# Patient Record
Sex: Female | Born: 1965 | Race: Black or African American | Hispanic: No | Marital: Married | Smoking: Never smoker
Health system: Southern US, Community
[De-identification: ages and names within clinical notes are randomized; demographics above are authoritative.]

## PROBLEM LIST (undated history)

## (undated) DIAGNOSIS — I10 Essential (primary) hypertension: Secondary | ICD-10-CM

## (undated) HISTORY — DX: Essential (primary) hypertension: I10

## (undated) HISTORY — PX: APPENDECTOMY: SHX54

---

## 1997-08-15 ENCOUNTER — Other Ambulatory Visit: Admission: RE | Admit: 1997-08-15 | Discharge: 1997-08-15 | Payer: Self-pay | Admitting: Family Medicine

## 2000-05-19 ENCOUNTER — Encounter: Payer: Self-pay | Admitting: Emergency Medicine

## 2000-05-19 ENCOUNTER — Emergency Department (HOSPITAL_COMMUNITY): Admission: EM | Admit: 2000-05-19 | Discharge: 2000-05-19 | Payer: Self-pay | Admitting: Emergency Medicine

## 2002-07-21 ENCOUNTER — Encounter: Admission: RE | Admit: 2002-07-21 | Discharge: 2002-07-21 | Payer: Self-pay | Admitting: Orthopedic Surgery

## 2002-07-21 ENCOUNTER — Encounter: Payer: Self-pay | Admitting: Orthopedic Surgery

## 2003-04-23 ENCOUNTER — Inpatient Hospital Stay (HOSPITAL_COMMUNITY): Admission: AD | Admit: 2003-04-23 | Discharge: 2003-04-23 | Payer: Self-pay | Admitting: Obstetrics and Gynecology

## 2003-04-26 ENCOUNTER — Inpatient Hospital Stay (HOSPITAL_COMMUNITY): Admission: AD | Admit: 2003-04-26 | Discharge: 2003-04-27 | Payer: Self-pay | Admitting: Obstetrics and Gynecology

## 2003-04-28 HISTORY — PX: TUBAL LIGATION: SHX77

## 2003-05-10 ENCOUNTER — Inpatient Hospital Stay (HOSPITAL_COMMUNITY): Admission: AD | Admit: 2003-05-10 | Discharge: 2003-05-10 | Payer: Self-pay | Admitting: Obstetrics and Gynecology

## 2003-05-13 ENCOUNTER — Inpatient Hospital Stay (HOSPITAL_COMMUNITY): Admission: AD | Admit: 2003-05-13 | Discharge: 2003-05-14 | Payer: Self-pay | Admitting: *Deleted

## 2003-05-18 ENCOUNTER — Other Ambulatory Visit: Admission: RE | Admit: 2003-05-18 | Discharge: 2003-05-18 | Payer: Self-pay | Admitting: Obstetrics and Gynecology

## 2003-10-08 ENCOUNTER — Inpatient Hospital Stay (HOSPITAL_COMMUNITY): Admission: AD | Admit: 2003-10-08 | Discharge: 2003-10-09 | Payer: Self-pay | Admitting: Obstetrics and Gynecology

## 2003-11-12 ENCOUNTER — Observation Stay (HOSPITAL_COMMUNITY): Admission: AD | Admit: 2003-11-12 | Discharge: 2003-11-13 | Payer: Self-pay | Admitting: Obstetrics and Gynecology

## 2003-11-15 ENCOUNTER — Inpatient Hospital Stay (HOSPITAL_COMMUNITY): Admission: AD | Admit: 2003-11-15 | Discharge: 2003-11-18 | Payer: Self-pay | Admitting: Obstetrics and Gynecology

## 2003-11-15 ENCOUNTER — Encounter (INDEPENDENT_AMBULATORY_CARE_PROVIDER_SITE_OTHER): Payer: Self-pay | Admitting: *Deleted

## 2004-12-07 IMAGING — US US OB COMP LESS 14 WK
1 series · 14 of 28 positions shown · non-contrast
Comparison: none

CLINICAL DATA: 8 weeks, dehydration and spotting.

[Series 1: unknown · 0.32mm/px · 14 of 36 slices shown]
[im 2/36]
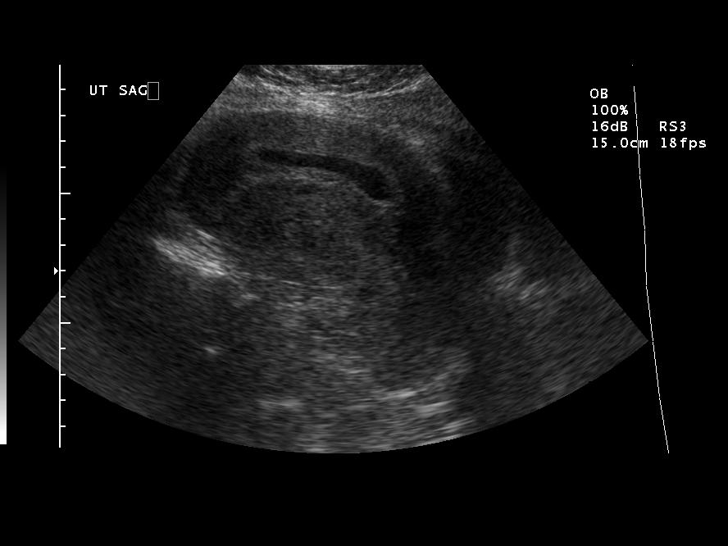
[im 4/36]
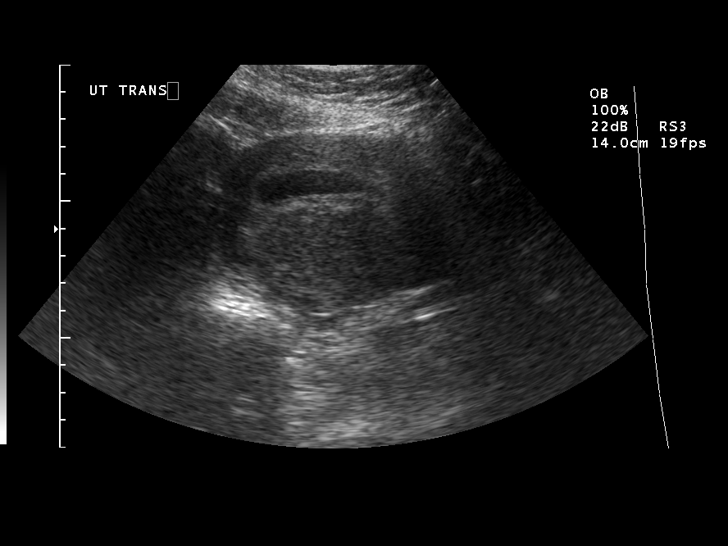
[im 7/36]
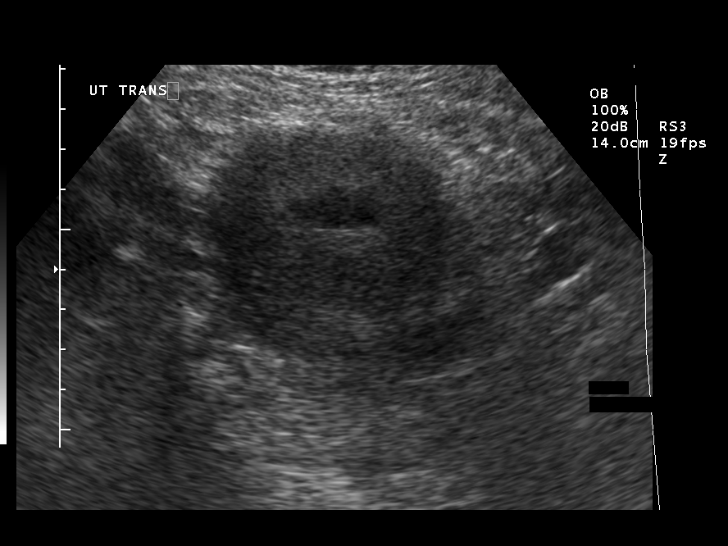
[im 10/36]
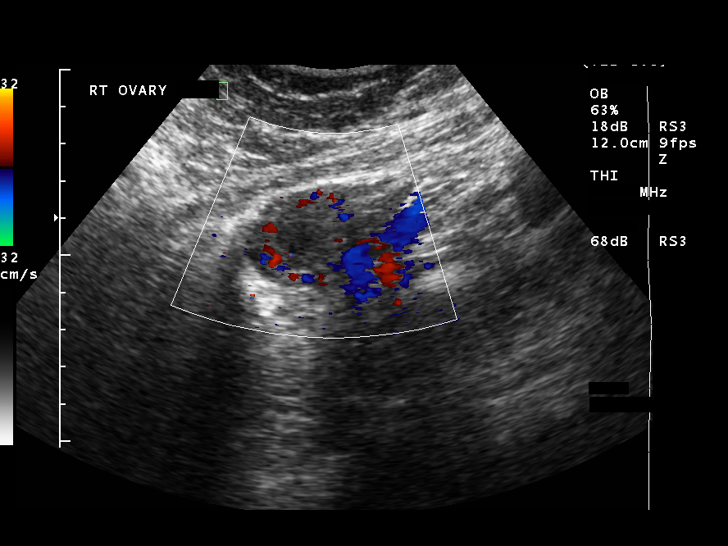
[im 12/36]
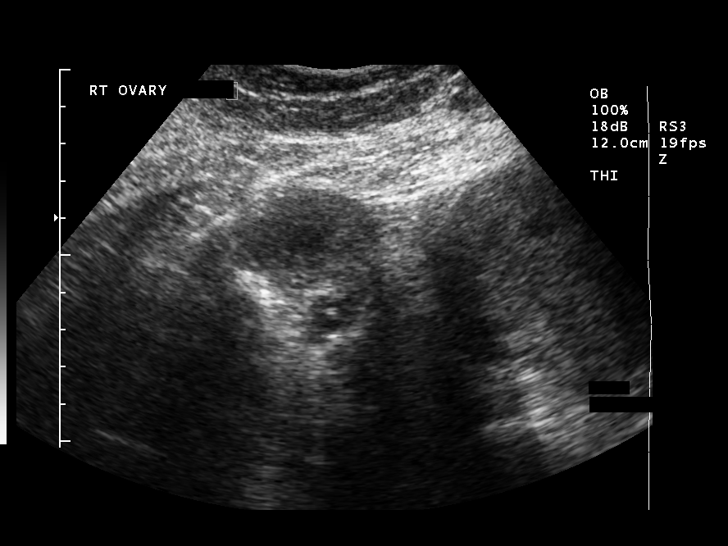
[im 15/36]
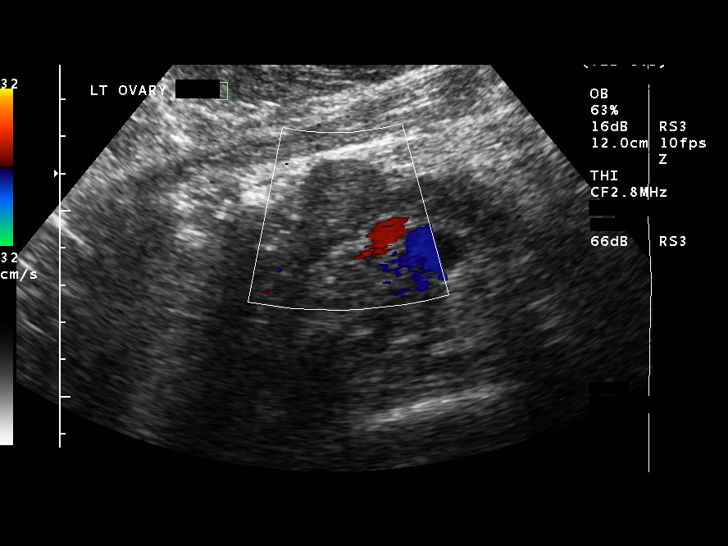
[im 17/36]
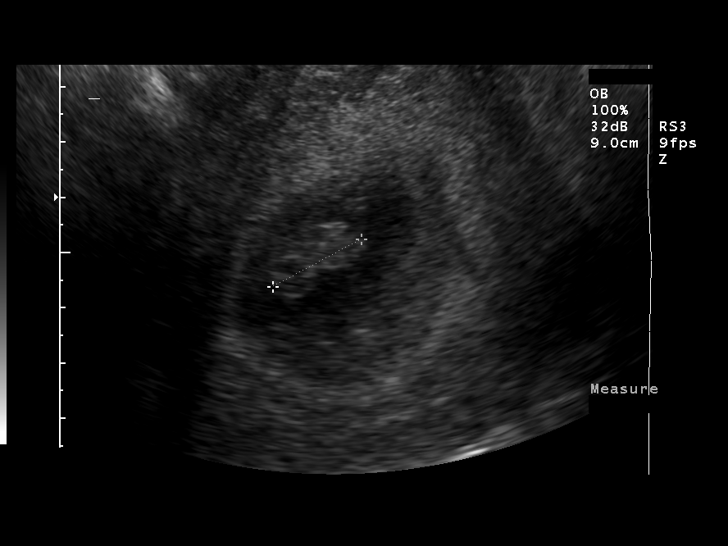
[im 20/36]
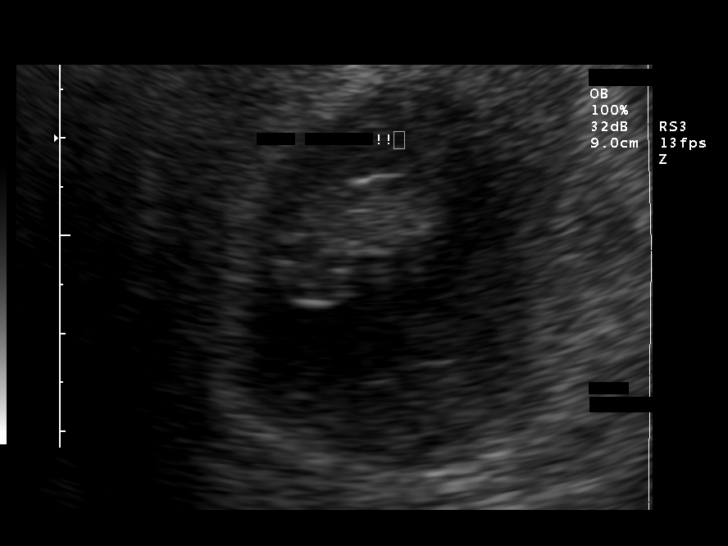
[im 23/36]
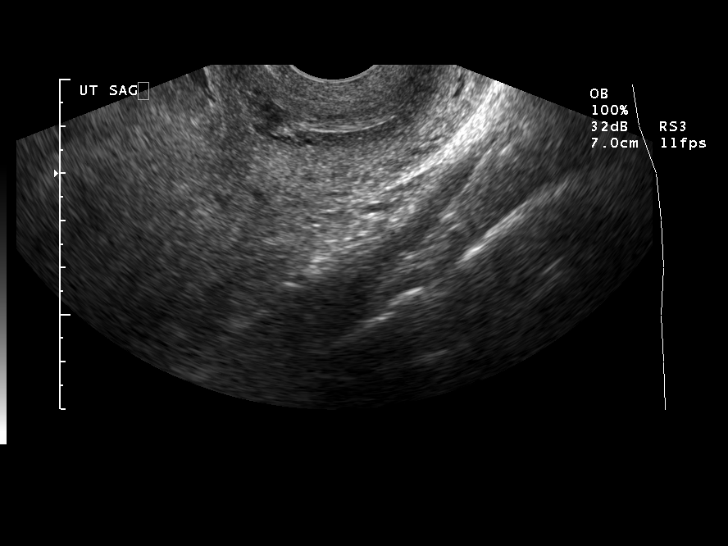
[im 25/36]
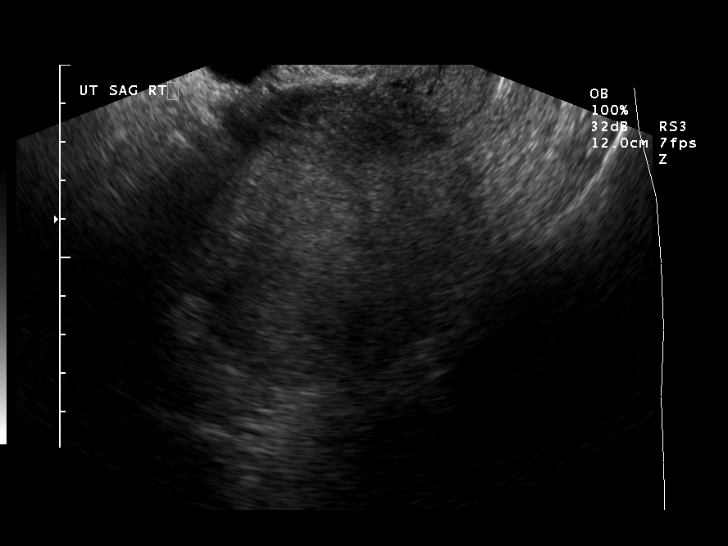
[im 28/36]
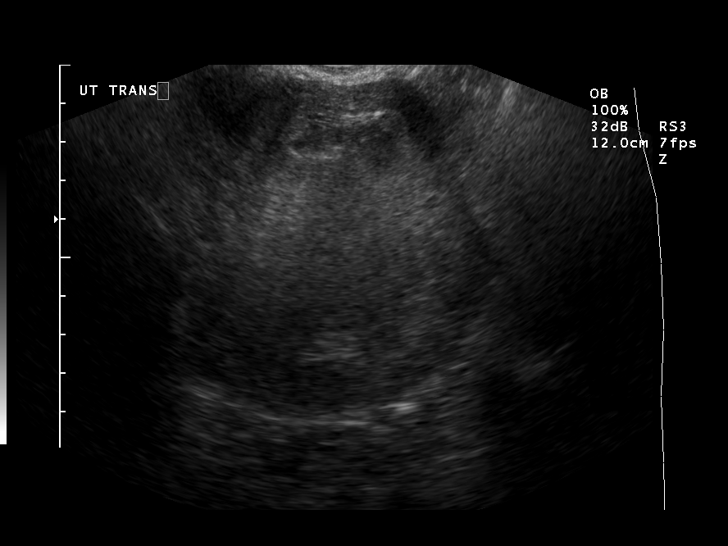
[im 30/36]
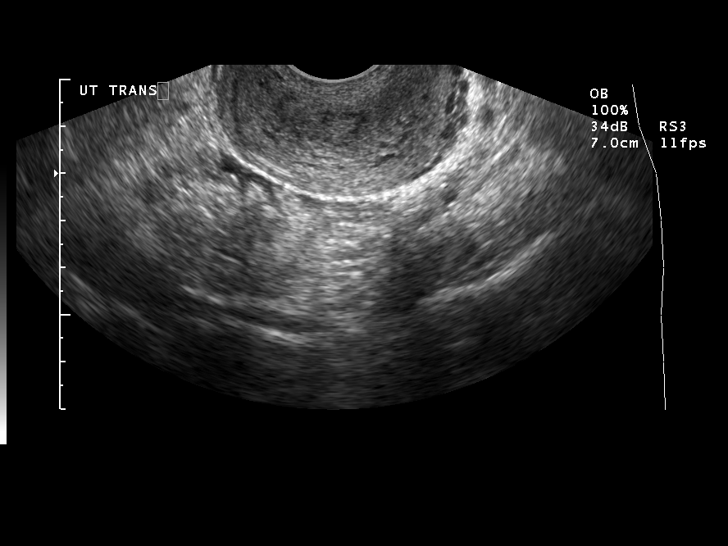
[im 33/36]
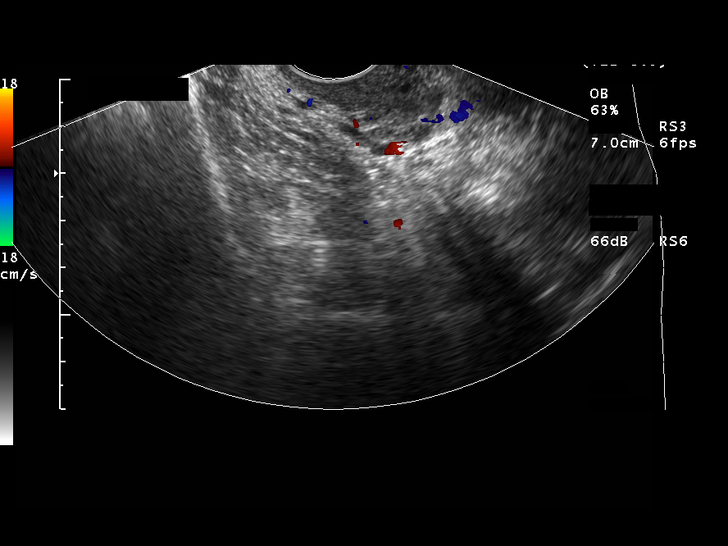
[im 36/36]
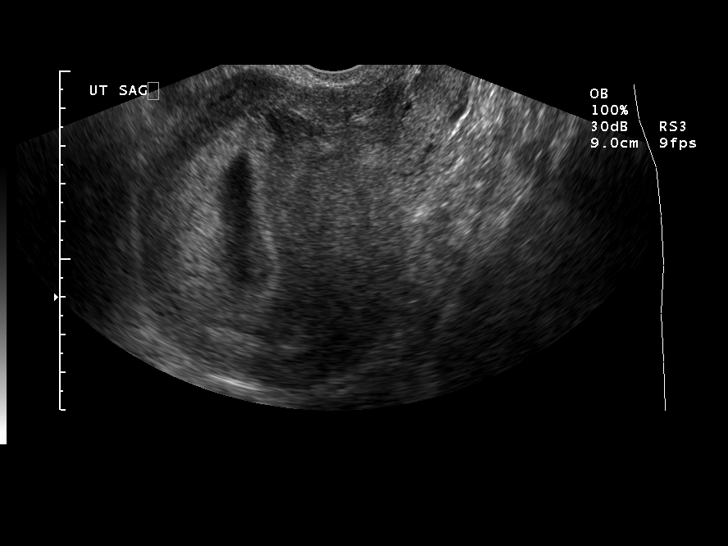

[14 of 28 positions shown; findings below may reference images not displayed]

OBSTETRICAL ULTRASOUND LESS THAN 14 WEEKS WITH ENDOVAGINAL EVALUATION
 NUMBER OF FETUSES:  1
 HEART RATE:  175 BPM

 CRL:  1.8 cm, 8 weeks 2 days

 ULTRASOUND EDC:  12/04/03

 Fetal anatomy could not be evaluated due to the early gestational age.

 MATERNAL FINDINGS:
 Both ovaries are within normal limits.

 IMPRESSION 
 Single living intrauterine gestation at 8 weeks 2 days gestational age by crown-rump length.
 Ovaries are sonographically normal on transabdominal imaging but cannot be reliably identified on endovaginal scanning.
 An apparent focal hypoechoic region in the posterior myometrium on transabdominal scanning could not be confirmed on endovaginal evaluation.  No definite fibroids are identified.

## 2006-05-12 ENCOUNTER — Emergency Department (HOSPITAL_COMMUNITY): Admission: EM | Admit: 2006-05-12 | Discharge: 2006-05-12 | Payer: Self-pay | Admitting: Emergency Medicine

## 2008-02-09 ENCOUNTER — Ambulatory Visit (HOSPITAL_COMMUNITY): Admission: RE | Admit: 2008-02-09 | Discharge: 2008-02-09 | Payer: Self-pay | Admitting: Obstetrics and Gynecology

## 2008-02-14 ENCOUNTER — Ambulatory Visit (HOSPITAL_COMMUNITY): Admission: RE | Admit: 2008-02-14 | Discharge: 2008-02-14 | Payer: Self-pay | Admitting: Obstetrics and Gynecology

## 2008-02-14 ENCOUNTER — Encounter (INDEPENDENT_AMBULATORY_CARE_PROVIDER_SITE_OTHER): Payer: Self-pay | Admitting: Obstetrics and Gynecology

## 2008-05-24 ENCOUNTER — Emergency Department (HOSPITAL_COMMUNITY): Admission: EM | Admit: 2008-05-24 | Discharge: 2008-05-24 | Payer: Self-pay | Admitting: Family Medicine

## 2010-05-18 ENCOUNTER — Encounter: Payer: Self-pay | Admitting: Surgery

## 2010-05-18 ENCOUNTER — Encounter: Payer: Self-pay | Admitting: Family Medicine

## 2010-06-11 ENCOUNTER — Other Ambulatory Visit (HOSPITAL_COMMUNITY): Payer: Self-pay | Admitting: Obstetrics and Gynecology

## 2010-06-11 DIAGNOSIS — Z1231 Encounter for screening mammogram for malignant neoplasm of breast: Secondary | ICD-10-CM

## 2010-06-17 ENCOUNTER — Ambulatory Visit (HOSPITAL_COMMUNITY)
Admission: RE | Admit: 2010-06-17 | Discharge: 2010-06-17 | Disposition: A | Discharge status: Home / Self Care | Payer: BC Managed Care – PPO | Source: Ambulatory Visit | Attending: Obstetrics and Gynecology | Admitting: Obstetrics and Gynecology

## 2010-06-17 DIAGNOSIS — Z1231 Encounter for screening mammogram for malignant neoplasm of breast: Secondary | ICD-10-CM | POA: Insufficient documentation

## 2010-08-03 ENCOUNTER — Emergency Department (HOSPITAL_COMMUNITY)
Admission: EM | Admit: 2010-08-03 | Discharge: 2010-08-03 | Disposition: A | Discharge status: Home / Self Care | Payer: BC Managed Care – PPO | Attending: Emergency Medicine | Admitting: Emergency Medicine

## 2010-08-03 ENCOUNTER — Emergency Department (HOSPITAL_COMMUNITY): Payer: BC Managed Care – PPO

## 2010-08-03 DIAGNOSIS — M7989 Other specified soft tissue disorders: Secondary | ICD-10-CM | POA: Insufficient documentation

## 2010-08-03 DIAGNOSIS — X500XXA Overexertion from strenuous movement or load, initial encounter: Secondary | ICD-10-CM | POA: Insufficient documentation

## 2010-08-03 DIAGNOSIS — Y9229 Other specified public building as the place of occurrence of the external cause: Secondary | ICD-10-CM | POA: Insufficient documentation

## 2010-08-03 DIAGNOSIS — S93409A Sprain of unspecified ligament of unspecified ankle, initial encounter: Secondary | ICD-10-CM | POA: Insufficient documentation

## 2010-08-03 DIAGNOSIS — M25579 Pain in unspecified ankle and joints of unspecified foot: Secondary | ICD-10-CM | POA: Insufficient documentation

## 2010-09-09 NOTE — Op Note (Signed)
NAME:  Dawn Reilly, Dawn Reilly NO.:  [id number]   MEDICAL RECORD NO.:  [id number]          PATIENT TYPE:  AMB   LOCATION:  SDC                           FACILITY:  [facility]   PHYSICIAN:  Maxie Better, M.D.DATE OF BIRTH:  07/22/1965   DATE OF PROCEDURE:  02/14/2008  DATE OF DISCHARGE:                               OPERATIVE REPORT   PREOPERATIVE DIAGNOSES:  Dysfunctional uterine bleeding, thickened  endometrium.   PROCEDURES:  Diagnostic hysteroscopy, dilation and curettage.   DIAGNOSIS DIAGNOSES:  Dysfunctional uterine bleeding, thickened  endometrium.   ANESTHESIA:  General paracervical block.   SURGEON:  Maxie Better, MD   ASSISTANT:  None.   DESCRIPTION OF PROCEDURE:  Under adequate general anesthesia, the  patient was placed into dorsal lithotomy position.  She was sterilely  prepped and draped in the usual fashion.  Examination under anesthesia  revealed uterus just slightly enlarged.  No adnexal masses could be  appreciated.  The bladder was catheterized for a large amount of urine.  Bivalve speculum was placed in the vagina, 10 mL of 1% nesicaine was  injected at 3 and 9 o'clock positions paracervically.  Anterior lip of  the cervix was grasped with a single-tooth tenaculum.  The cervix was  serially dilated up to #[facility] dilator.  A resectoscope which was  sorbitol primed was introduced into the uterine cavity.  The left tubal  ostium was seen better than the right.  Thickened endometrium was noted  anteriorly.  No defined polyp seen.  Endocervical canal was normal on  entering the uterine cavity.  The resectoscope was removed.  The cavity  was then curetted for large amount of tissue, resectoscope was re-  inserted, cavity inspected, and instruments were then removed from the  vagina.  Specimen labeled endometrial curetting was sent to Pathology.  Estimated blood loss was minimal.  Fluid deficit was 80 mL. Complication  was none.  The patient  tolerated the procedure well and was transferred  to the recovery room in stable condition.      Maxie Better, M.D.  Electronically Signed     [place]/MEDQ  D:  02/14/2008  T:  02/15/2008  Job:  [id number]

## 2010-09-12 NOTE — H&P (Signed)
NAME:  Dawn Reilly, Dawn Reilly                        ACCOUNT NO.:  [id number]   MEDICAL RECORD NO.:  [id number]                   PATIENT TYPE:  INP   LOCATION:  9198                                 FACILITY:  [facility]   PHYSICIAN:  Dineen Kid. Rana Snare, M.D.                 DATE OF BIRTH:  11-Jun-1965   DATE OF ADMISSION:  11/15/2003  DATE OF DISCHARGE:                                HISTORY & PHYSICAL   HISTORY OF PRESENT ILLNESS:  Dawn Reilly is a 45 year old, G5, P3, A1, at 36-  1/2 weeks who presents in labor.  Her pregnancy has been complicated by  preterm labor and previous cesarean section.  She is here for repeat  cesarean section.  She also desires sterilization.  Her EDC was December 04, 2003.   PAST MEDICAL HISTORY:  Significant for history of shingles.   PAST SURGICAL HISTORY:  1. She has had three cesarean sections.  2. Appendectomy.   MEDICATIONS:  Prenatal vitamins.   ALLERGIES:  SHELLFISH.   PHYSICAL EXAMINATION:  VITAL SIGNS:  Blood pressure 120/82.  HEART:  Regular rate and rhythm.  LUNGS:  Clear to auscultation bilaterally.  ABDOMEN:  Gravid, nontender.  PELVIC:  Cervix is 2, 50% and high.   ASSESSMENT:  Intrauterine pregnancy at 37-1/2 weeks, labor.  Previous  cesarean section.  Desires repeat.  Also desires sterility.   PLAN:  Repeat low cervical transverse cesarean section and [place] tubal  ligation.  The risks and benefits were discussed at length which included  but not limited to risk of infection, bleeding, damage to uterus, tubes,  ovaries, bowels, bladder, __________, risk of tubal failure 5 out of 1000.  She gives informed consent and wishes to proceed.                                               Dineen Kid Rana Snare, M.D.    DCL/MEDQ  D:  11/15/2003  T:  11/15/2003  Job:  [id number]

## 2010-09-12 NOTE — Discharge Summary (Signed)
NAME:  Dawn Reilly, Dawn Reilly NO.:  [id number]   MEDICAL RECORD NO.:  [id number]                   PATIENT TYPE:  INP   LOCATION:  9125                                 FACILITY:  [facility]   PHYSICIAN:  Freddy Finner, M.D.                DATE OF BIRTH:  1965-12-21   DATE OF ADMISSION:  11/15/2003  DATE OF DISCHARGE:  11/18/2003                                 DISCHARGE SUMMARY   ADMITTING DIAGNOSES:  1. Intrauterine pregnancy at 37-and-a-half weeks.  2. Labor.  3. Previous cesarean section with desired repeat.  4. Multiparity, desires sterility.   DISCHARGE DIAGNOSES:  1. Status post low transverse cesarean section.  2. Viable female infant.  3. Permanent sterilization.   PROCEDURE:  1. Repeat low transverse cesarean section.  2. [place] bilateral tubal ligation.   REASON FOR ADMISSION:  Please see dictated H&P.   HOSPITAL COURSE:  The patient was a 45 year old gravida 5 para 3 who  presented to [facility] in early labor.  Cervix was noted  to be 2 cm dilated, 50% effaced, vertex at a -3 station, with contractions  occurring regularly every 5-7 minutes.  The patient had had a previous  cesarean section and desired repeat.  Due to multiparity, the patient had  also requested tubal ligation.  The patient was then transferred to the  operating room where spinal anesthesia was administered without difficulty.  A low transverse incision was made with the delivery of a viable female  infant weighing 7 pounds 0 ounces with Apgars of 8 at one minute and 9 at  five minutes.  Umbilical cord pH was 7.35.  Bilateral tubal ligation was  performed without difficulty.  The tolerated the procedure well and was  taken to the recovery room in stable condition.  On postoperative day #1 the  patient was without complaint.  Vital signs were stable; she was afebrile.  Abdomen soft with good return of bowel function.  Fundus was firm and  nontender.  Abdominal  dressing was noted to have a small amount of drainage  noted on bandage.  Lungs were clear to auscultation.  Labs revealed  hemoglobin of 10.4.  On postoperative day #2 the patient was without  complaint.  Vital signs were stable.  Fundus was firm and nontender.  Abdominal dressing had been removed revealing an incision that was clean,  dry, and intact.  On postoperative day #3 the patient was without complaint.  Vital signs were stable.  Fundus was firm and nontender.  Incision was  clean, dry, and intact.  Staples were removed and the patient was discharged  home.   CONDITION ON DISCHARGE:  Good.   DIET:  Regular as tolerated.   ACTIVITY:  No heavy lifting, no driving x2 weeks, no vaginal entry.   FOLLOW-UP:  The patient is to follow up in the office in 1 week for an  incision check.  She is to call for temperature greater than 100 degrees,  persistent nausea and vomiting, heavy vaginal bleeding, and/or redness or  drainage from the incisional site.   DISCHARGE MEDICATIONS:  1. Percocet 5/325 #30 one p.o. q.4-6h. p.r.n.  2. Motrin 600 mg q.6h.  3. Prenatal vitamins one p.o. daily.  4. Colace one p.o. daily p.r.n.     Julio Sicks, N.P.                        Freddy Finner, M.D.    CC/MEDQ  D:  12/06/2003  T:  12/06/2003  Job:  [id number]

## 2010-09-12 NOTE — Op Note (Signed)
NAME:  ALASHIA, BROWNFIELD                        ACCOUNT NO.:  [id number]   MEDICAL RECORD NO.:  [id number]                   PATIENT TYPE:  INP   LOCATION:  9125                                 FACILITY:  [facility]   PHYSICIAN:  Dineen Kid. Rana Snare, M.D.                 DATE OF BIRTH:  1965/10/31   DATE OF PROCEDURE:  11/15/2003  DATE OF DISCHARGE:                                 OPERATIVE REPORT   PREOPERATIVE DIAGNOSES:  Intrauterine pregnancy at 37 1/2 weeks, labor,  previous cesarean section with desired repeat, multiparity with desired  sterility.   POSTOPERATIVE DIAGNOSES:  Intrauterine pregnancy at 37 1/2 weeks, labor,  previous cesarean section with desired repeat, multiparity with desired  sterility.   PROCEDURE:  Repeat low transverse cesarean section and bilateral tubal  ligation.   SURGEON:  Dineen Kid. Rana Snare, M.D.   ANESTHESIA:  Spinal.   INDICATIONS FOR PROCEDURE:  Ms. Stanzione is a 45 year old, G5, P3, A1 who  presented in early labor.  The cervix is 2, 50%, -3 station with  contractions regularly every 5-7 minutes. She desires repeat cesarean  section and tubal ligation. The risks and benefits were discussed at length  and full consent was obtained.   FINDINGS:  Viable female infant, Apgars were 8&9, pH arterial 7.35, weight 7  pounds 0 ounces.   DESCRIPTION OF PROCEDURE:  After adequate analgesia, the patient was placed  in the supine position, left lateral tilt, she was sterilely prepped with  Hibiclens solution, Foley catheter was sterilely placed. A Pfannenstiel skin  incision was made 2 fingerbreadths above the pubic symphysis, taken down  sharply in the fascia, incised transversely superiorly and inferiorly up the  belly of the rectus muscle which was separated sharply in the midline and  the peritoneum was entered sharply. A low segment _________ incision was  made above the bladder flap and was taken down sharply to the amniotic sac,  amniotomy is performed. The  infant's vertex was then delivered  atraumatically, the nose pharynx suctioned. The infant's head was delivered,  cord clamped and cut and handed to the pediatricians for resuscitation.  Cord blood was then obtained. The placenta extracted manually, the uterus  was exteriorized, wiped clean with a dry lap, amniotomy incision was closed  in two layers, the first being a running locking layer with #0 Monocryl  suture, the second being an imbricating layer of #0 Monocryl suture. The  right fallopian tube was identified by the fimbriated end, mid portion of  the tube was grasped with a Babcock clamp, two sutures of #0 plain were  placed through the mesosalpinx, doubly ligated and central portion excised.  The proximal portion was then ligated with 2-0 silk. The left fallopian tube  was identified by the fimbriated end, mid portion of the tube grasped with a  Babcock clamp, two sutures of #0 plain passed through the mesosalpinx,  doubly ligated, central portion  excised. The tubal ostia remained hemostatic  with Bovie cautery. The proximal portion was then ligated with 2-0 silk. The  uterus is placed back in the peritoneal cavity, reexamination of the tubes  revealed good hemostasis. The incision was also hemostatic.  Irrigation was  applied, adequate hemostasis was ensured.  The peritoneum was then closed  with a #0 Monocryl, rectus muscles plicated, irrigation was applied and  after adequate hemostasis the fascia was then closed with #0 PDS in running  fashion, irrigation was applied and after adequate  hemostasis with skin staples, Steri-Strips applied. The patient tolerated  the procedure well and was stable on transfer to the recovery room. Sponge  and instrument count was normal x3.  Estimated blood loss was 800 mL.  The  patient received 1 g of Rocephin after delivery of placenta.                                               Dineen Kid Rana Snare, M.D.    DCL/MEDQ  D:  11/15/2003  T:   11/16/2003  Job:  [id number]

## 2011-01-27 LAB — CBC
HCT: 37.6
Hemoglobin: 12.3
MCHC: 32.8
Platelets: 229
WBC: 6.1

## 2013-07-17 ENCOUNTER — Encounter (HOSPITAL_COMMUNITY): Payer: Self-pay | Admitting: Emergency Medicine

## 2013-07-17 ENCOUNTER — Emergency Department (INDEPENDENT_AMBULATORY_CARE_PROVIDER_SITE_OTHER)
Admission: EM | Admit: 2013-07-17 | Discharge: 2013-07-17 | Disposition: A | Discharge status: Home / Self Care | Payer: Self-pay | Source: Home / Self Care | Attending: Family Medicine | Admitting: Family Medicine

## 2013-07-17 DIAGNOSIS — L089 Local infection of the skin and subcutaneous tissue, unspecified: Secondary | ICD-10-CM

## 2013-07-17 MED ORDER — SULFAMETHOXAZOLE-TRIMETHOPRIM 800-160 MG PO TABS: 2.0000 | ORAL_TABLET | Freq: Two times a day (BID) | ORAL | Status: DC

## 2013-07-17 NOTE — ED Notes (Signed)
C/o pain 3 weeks in left middle finger. NAD

## 2013-07-17 NOTE — Discharge Instructions (Signed)
Thank you for coming in today. Take bactrim twice daily for 1 week.  Continue warm water soaks.  Come back as needed.

## 2013-07-19 NOTE — ED Provider Notes (Signed)
Jonette Mateejuana W Roselyn MeierWinnix is a 48 y.o. female who presents to Urgent Care today for left third digit paronychia. Patient has pain at the radial side of her left third digit nail border for the last 3 weeks. She has tried warm water soaks and antibiotic ointment which have not helped. No fevers or chills radiating pain nausea vomiting or diarrhea. She feels well otherwise.   History reviewed. No pertinent past medical history. History  Substance Use Topics  . Smoking status: Never Smoker   . Smokeless tobacco: Not on file  . Alcohol Use: Not on file   ROS as above Medications: No current facility-administered medications for this encounter.   Current Outpatient Prescriptions  Medication Sig Dispense Refill  . sulfamethoxazole-trimethoprim (SEPTRA DS) 800-160 MG per tablet Take 2 tablets by mouth 2 (two) times daily.  28 tablet  0    Exam:  BP 154/86  Pulse 74  Temp(Src) 97.9 F (36.6 C) (Oral)  Resp 18  SpO2 98% Gen: Well NAD Left third digit. Erythema and induration without fluctuance or abscess the radial nail border.  No results found for this or any previous visit (from the past 24 hour(s)). No results found.  Assessment and Plan: 48 y.o. female with paronychia. Plan she with Bactrim. Followup as needed.  Discussed warning signs or symptoms. Please see discharge instructions. Patient expresses understanding.    Rodolph BongEvan S Hughey Rittenberry, MD 07/19/13 [phone]

## 2014-01-09 ENCOUNTER — Emergency Department (HOSPITAL_COMMUNITY)
Admission: EM | Admit: 2014-01-09 | Discharge: 2014-01-09 | Disposition: A | Discharge status: Home / Self Care | Payer: No Typology Code available for payment source | Attending: Emergency Medicine | Admitting: Emergency Medicine

## 2014-01-09 ENCOUNTER — Emergency Department (HOSPITAL_COMMUNITY): Payer: No Typology Code available for payment source

## 2014-01-09 ENCOUNTER — Encounter (HOSPITAL_COMMUNITY): Payer: Self-pay | Admitting: Emergency Medicine

## 2014-01-09 DIAGNOSIS — M25512 Pain in left shoulder: Secondary | ICD-10-CM

## 2014-01-09 DIAGNOSIS — S46909A Unspecified injury of unspecified muscle, fascia and tendon at shoulder and upper arm level, unspecified arm, initial encounter: Secondary | ICD-10-CM | POA: Diagnosis present

## 2014-01-09 DIAGNOSIS — Y92410 Unspecified street and highway as the place of occurrence of the external cause: Secondary | ICD-10-CM | POA: Diagnosis not present

## 2014-01-09 DIAGNOSIS — S4980XA Other specified injuries of shoulder and upper arm, unspecified arm, initial encounter: Secondary | ICD-10-CM | POA: Insufficient documentation

## 2014-01-09 DIAGNOSIS — Z792 Long term (current) use of antibiotics: Secondary | ICD-10-CM | POA: Insufficient documentation

## 2014-01-09 DIAGNOSIS — Y9389 Activity, other specified: Secondary | ICD-10-CM | POA: Diagnosis not present

## 2014-01-09 MED ORDER — TRAMADOL HCL 50 MG PO TABS: 50.0000 mg | ORAL_TABLET | Freq: Four times a day (QID) | ORAL | Status: DC | PRN

## 2014-01-09 MED ORDER — NAPROXEN 500 MG PO TABS: 500.0000 mg | ORAL_TABLET | Freq: Two times a day (BID) | ORAL | Status: DC
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
## 2014-01-09 NOTE — ED Notes (Signed)
Arm spling applied to L arm
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
## 2014-01-13 NOTE — ED Provider Notes (Signed)
Medical screening examination/treatment/procedure(s) were performed by non-physician practitioner and as supervising physician I was immediately available for consultation/collaboration.  Nanna Ertle T Masen Luallen, MD 01/13/14 0731 

## 2014-09-05 ENCOUNTER — Encounter (HOSPITAL_COMMUNITY): Payer: Self-pay | Admitting: Emergency Medicine

## 2014-09-05 ENCOUNTER — Emergency Department (HOSPITAL_COMMUNITY)
Admission: EM | Admit: 2014-09-05 | Discharge: 2014-09-05 | Disposition: A | Discharge status: Home / Self Care | Payer: BLUE CROSS/BLUE SHIELD | Attending: Emergency Medicine | Admitting: Emergency Medicine

## 2014-09-05 ENCOUNTER — Emergency Department (HOSPITAL_COMMUNITY): Payer: BLUE CROSS/BLUE SHIELD

## 2014-09-05 DIAGNOSIS — N730 Acute parametritis and pelvic cellulitis: Secondary | ICD-10-CM | POA: Insufficient documentation

## 2014-09-05 DIAGNOSIS — Z791 Long term (current) use of non-steroidal anti-inflammatories (NSAID): Secondary | ICD-10-CM | POA: Insufficient documentation

## 2014-09-05 DIAGNOSIS — R1030 Lower abdominal pain, unspecified: Secondary | ICD-10-CM | POA: Diagnosis present

## 2014-09-05 DIAGNOSIS — N760 Acute vaginitis: Secondary | ICD-10-CM | POA: Diagnosis not present

## 2014-09-05 DIAGNOSIS — B9689 Other specified bacterial agents as the cause of diseases classified elsewhere: Secondary | ICD-10-CM

## 2014-09-05 DIAGNOSIS — Z792 Long term (current) use of antibiotics: Secondary | ICD-10-CM | POA: Diagnosis not present

## 2014-09-05 DIAGNOSIS — N8320 Unspecified ovarian cysts: Secondary | ICD-10-CM | POA: Insufficient documentation

## 2014-09-05 DIAGNOSIS — R109 Unspecified abdominal pain: Secondary | ICD-10-CM

## 2014-09-05 DIAGNOSIS — Z79899 Other long term (current) drug therapy: Secondary | ICD-10-CM | POA: Diagnosis not present

## 2014-09-05 DIAGNOSIS — N83201 Unspecified ovarian cyst, right side: Secondary | ICD-10-CM

## 2014-09-05 DIAGNOSIS — Z3202 Encounter for pregnancy test, result negative: Secondary | ICD-10-CM | POA: Diagnosis not present

## 2014-09-05 LAB — URINALYSIS, ROUTINE W REFLEX MICROSCOPIC
BILIRUBIN URINE: NEGATIVE
Glucose, UA: NEGATIVE mg/dL
Ketones, ur: NEGATIVE mg/dL
Nitrite: NEGATIVE
Protein, ur: NEGATIVE mg/dL
SPECIFIC GRAVITY, URINE: 1.021 (ref 1.005–1.030)
UROBILINOGEN UA: 1 mg/dL (ref 0.0–1.0)
pH: 5.5 (ref 5.0–8.0)

## 2014-09-05 LAB — CBC WITH DIFFERENTIAL/PLATELET
Basophils Absolute: 0 10*3/uL (ref 0.0–0.1)
Basophils Relative: 0 % (ref 0–1)
EOS PCT: 4 % (ref 0–5)
Eosinophils Absolute: 0.2 10*3/uL (ref 0.0–0.7)
HCT: 39.6 % (ref 36.0–46.0)
Hemoglobin: 12.8 g/dL (ref 12.0–15.0)
LYMPHS ABS: 1.5 10*3/uL (ref 0.7–4.0)
LYMPHS PCT: 28 % (ref 12–46)
MCH: 28.8 pg (ref 26.0–34.0)
MCHC: 32.3 g/dL (ref 30.0–36.0)
MCV: 89 fL (ref 78.0–100.0)
MONO ABS: 0.6 10*3/uL (ref 0.1–1.0)
Monocytes Relative: 11 % (ref 3–12)
NEUTROS PCT: 57 % (ref 43–77)
Neutro Abs: 3 10*3/uL (ref 1.7–7.7)
PLATELETS: 208 10*3/uL (ref 150–400)
RBC: 4.45 MIL/uL (ref 3.87–5.11)
RDW: 14.2 % (ref 11.5–15.5)
WBC: 5.3 10*3/uL (ref 4.0–10.5)

## 2014-09-05 LAB — COMPREHENSIVE METABOLIC PANEL
ALK PHOS: 49 U/L (ref 38–126)
ALT: 20 U/L (ref 14–54)
AST: 19 U/L (ref 15–41)
Albumin: 3.4 g/dL — ABNORMAL LOW (ref 3.5–5.0)
Anion gap: 10 (ref 5–15)
BUN: 10 mg/dL (ref 6–20)
CALCIUM: 9 mg/dL (ref 8.9–10.3)
CO2: 25 mmol/L (ref 22–32)
Chloride: 102 mmol/L (ref 101–111)
Creatinine, Ser: 0.66 mg/dL (ref 0.44–1.00)
GFR calc Af Amer: >60 mL/min (ref >60)
GFR calc non Af Amer: >60 mL/min (ref >60)
Glucose, Bld: 91 mg/dL (ref 70–99)
POTASSIUM: 4 mmol/L (ref 3.5–5.1)
SODIUM: 137 mmol/L (ref 135–145)
TOTAL PROTEIN: 7.5 g/dL (ref 6.5–8.1)
Total Bilirubin: 0.5 mg/dL (ref 0.3–1.2)

## 2014-09-05 LAB — POC URINE PREG, ED: PREG TEST UR: NEGATIVE

## 2014-09-05 LAB — URINE MICROSCOPIC-ADD ON

## 2014-09-05 LAB — WET PREP, GENITAL
TRICH WET PREP: NONE SEEN
YEAST WET PREP: NONE SEEN

## 2014-09-05 MED ORDER — OXYCODONE-ACETAMINOPHEN 5-325 MG PO TABS
1.0000 | ORAL_TABLET | Freq: Once | ORAL | Status: AC
  Administered 2014-09-05: 1 via ORAL
  Filled 2014-09-05: qty 1

## 2014-09-05 MED ORDER — METRONIDAZOLE 500 MG PO TABS: 500.0000 mg | ORAL_TABLET | Freq: Two times a day (BID) | ORAL | Status: DC

## 2014-09-05 MED ORDER — OXYCODONE-ACETAMINOPHEN 5-325 MG PO TABS: 1.0000 | ORAL_TABLET | Freq: Four times a day (QID) | ORAL | Status: DC | PRN

## 2014-09-05 MED ORDER — DOXYCYCLINE HYCLATE 100 MG PO TABS
100.0000 mg | ORAL_TABLET | Freq: Once | ORAL | Status: AC
Start: 2014-09-05 — End: 2014-09-05
  Administered 2014-09-05: 100 mg via ORAL
  Filled 2014-09-05: qty 1

## 2014-09-05 MED ORDER — CEFTRIAXONE SODIUM 250 MG IJ SOLR
250.0000 mg | Freq: Once | INTRAMUSCULAR | Status: AC
  Administered 2014-09-05: 250 mg via INTRAMUSCULAR
  Filled 2014-09-05: qty 250

## 2014-09-05 MED ORDER — LIDOCAINE HCL (PF) 1 % IJ SOLN
INTRAMUSCULAR | Status: AC
  Administered 2014-09-05: 5 mL
  Filled 2014-09-05: qty 5

## 2014-09-05 MED ORDER — METRONIDAZOLE 500 MG PO TABS
500.0000 mg | ORAL_TABLET | Freq: Once | ORAL | Status: AC
  Administered 2014-09-05: 500 mg via ORAL
  Filled 2014-09-05: qty 1

## 2014-09-05 MED ORDER — DOXYCYCLINE HYCLATE 100 MG PO CAPS: 100.0000 mg | ORAL_CAPSULE | Freq: Two times a day (BID) | ORAL | Status: DC

## 2014-09-05 MED ORDER — LIDOCAINE HCL (PF) 1 % IJ SOLN
5.0000 mL | Freq: Once | INTRAMUSCULAR | Status: AC
  Administered 2014-09-05: 5 mL

## 2014-09-05 NOTE — ED Notes (Signed)
Pelvic pain radiating to hips; burning sensation. Been going on for 3 months intermittent. Sometimes pain worse after eating. Still has gallbladder but has had appendectomy. Reports 3 weeks ago saw blood in urine. No burning but increased frequency.

## 2014-09-05 NOTE — Discharge Instructions (Signed)
Return to the ED with any concerns including fever/chills, worsening pain, vomiting and not able to keep down liquids or antibiotics, decreased level of alertness/lethargy, or any other alarming symptoms  Per radiology report:  2.1 cm complex cyst of the right ovary with low level echoes, such as hemorrhagic cyst or small endometrioma. Follow-up with ultrasound in 6-12 weeks.

## 2014-09-05 NOTE — ED Provider Notes (Signed)
CSN: [id number]     Arrival date & time 09/05/14  0746 History   First MD Initiated Contact with Patient 09/05/14 [id number]     Chief Complaint  Patient presents with  . Abdominal Pain     (Consider location/radiation/quality/duration/timing/severity/associated sxs/prior Treatment) HPI  Pt presents with c/o lower abdominal/pelvic pain which is sharp and cramping in nature.  Symptoms have been intermittent over the past several week.  Yesterday pain was severe and she had to stop walking.  No dysuria, no increased frequency of urination, no urgency.  No change in stools.  Had one episode of vaginal spotting several weeks ago- states she does not have regular menses.  There are no other associated systemic symptoms, there are no other alleviating or modifying factors.  She has not had any treatment prior to arrival.    History reviewed. No pertinent past medical history. Past Surgical History  Procedure Laterality Date  . Appendectomy    . Cesarean section     History reviewed. No pertinent family history. History  Substance Use Topics  . Smoking status: Never Smoker   . Smokeless tobacco: Not on file  . Alcohol Use: Yes   OB History    No data available     Review of Systems  ROS reviewed and all otherwise negative except for mentioned in HPI    Allergies  Shellfish allergy  Home Medications   Prior to Admission medications   Medication Sig Start Date End Date Taking? Authorizing Provider  doxycycline (VIBRAMYCIN) 100 MG capsule Take 1 capsule (100 mg total) by mouth 2 (two) times daily. 09/05/14   Jerelyn Scott, MD  metroNIDAZOLE (FLAGYL) 500 MG tablet Take 1 tablet (500 mg total) by mouth 2 (two) times daily. 09/05/14   Jerelyn Scott, MD  naproxen (NAPROSYN) 500 MG tablet Take 1 tablet (500 mg total) by mouth 2 (two) times daily. Patient not taking: Reported on 09/05/2014 01/09/14   Santiago Glad, PA-C  oxyCODONE-acetaminophen (PERCOCET/ROXICET) 5-325 MG per tablet Take 1-2  tablets by mouth every 6 (six) hours as needed for severe pain. 09/05/14   Jerelyn Scott, MD  sulfamethoxazole-trimethoprim (SEPTRA DS) 800-160 MG per tablet Take 2 tablets by mouth 2 (two) times daily. Patient not taking: Reported on 09/05/2014 07/17/13   Rodolph Bong, MD  traMADol (ULTRAM) 50 MG tablet Take 1 tablet (50 mg total) by mouth every 6 (six) hours as needed. Patient not taking: Reported on 09/05/2014 01/09/14   Heather Laisure, PA-C   BP 133/81 mmHg  Pulse 85  Temp(Src) 98.3 F (36.8 C) (Oral)  Resp 20  SpO2 99%  Vitals reviewed Physical Exam  Physical Examination: General appearance - alert, well appearing, and in no distress Mental status - alert, oriented to person, place, and time Eyes - no conjunctival injection no scleral icterus Mouth - mucous membranes moist, pharynx normal without lesions Chest - clear to auscultation, no wheezes, rales or rhonchi, symmetric air entry Heart - normal rate, regular rhythm, normal S1, S2, no murmurs, rubs, clicks or gallops Abdomen - soft, midline lower pelvic tenderness to palpation, no gaurding or rebound, nondistended, no masses or organomegaly Pelvic - normal external genitalia, vulva, vagina, cervix, uterus and adnexa, + CMT, tenderness over uterus, no adnexal tenderness, yellowish discharge at cervical os Extremities - peripheral pulses normal, no pedal edema, no clubbing or cyanosis Skin - normal coloration and turgor, no rashes  ED Course  Procedures (including critical care time) Labs Review Labs Reviewed  WET PREP, GENITAL - Abnormal;  Notable for the following:    Clue Cells Wet Prep HPF POC MANY (*)    WBC, Wet Prep HPF POC TOO NUMEROUS TO COUNT (*)    All other components within normal limits  URINALYSIS, ROUTINE W REFLEX MICROSCOPIC - Abnormal; Notable for the following:    APPearance CLOUDY (*)    Hgb urine dipstick SMALL (*)    Leukocytes, UA TRACE (*)    All other components within normal limits  COMPREHENSIVE  METABOLIC PANEL - Abnormal; Notable for the following:    Albumin 3.4 (*)    All other components within normal limits  URINE MICROSCOPIC-ADD ON - Abnormal; Notable for the following:    Squamous Epithelial / LPF MANY (*)    Bacteria, UA MANY (*)    All other components within normal limits  URINE CULTURE  CBC WITH DIFFERENTIAL/PLATELET  POC URINE PREG, ED  GC/CHLAMYDIA PROBE AMP ()    Imaging Review [place] Transvaginal Non-ob  09/05/2014   CLINICAL DATA:  49 year old female with intermittent pelvic pain for 3 months. She 6 P 4. LMP 08/07/2014. Initial encounter.  EXAM: TRANSABDOMINAL AND TRANSVAGINAL ULTRASOUND OF PELVIS  TECHNIQUE: Both transabdominal and transvaginal ultrasound examinations of the pelvis were performed. Transabdominal technique was performed for global imaging of the pelvis including uterus, ovaries, adnexal regions, and pelvic cul-de-sac. It was necessary to proceed with endovaginal exam following the transabdominal exam to visualize the endometrium.  COMPARISON:  None  FINDINGS: Uterus  Measurements: 11.0 x 6.1 x 5.1 cm. No fibroids or other mass visualized.  Endometrium  Thickness: 15 mm.  No focal abnormality visualized.  Right ovary  Measurements: 3.4 x 3.3 x 2.6 cm. 2.1 cm hypoechoic area with fairly homogeneous low level echoes (image 39). No associated vascularity.  Left ovary  Measurements: 2.5 x 1.4 x 2.9 cm. Normal appearance/no adnexal mass.  Other findings  Small volume simple appearing pelvic free fluid.  IMPRESSION: 1. 2.1 cm complex cyst of the right ovary with low level echoes, such as hemorrhagic cyst or small endometrioma. Follow-up with ultrasound in 6-12 weeks. 2. Otherwise negative sonographic appearance of the pelvis. 3. The above recommendation follows the consensus statement: Management of Asymptomatic Ovarian and Other Adnexal Cysts Imaged at US: Society of Radiologists in Ultrasound Consensus Conference Statement. Radiology 2010; [phone].    Electronically Signed   By: Odessa FlemingH  Hall M.D.   On: 09/05/2014 14:41   [place] Pelvis Complete  09/05/2014   CLINICAL DATA:  49 year old female with intermittent pelvic pain for 3 months. She 6 P 4. LMP 08/07/2014. Initial encounter.  EXAM: TRANSABDOMINAL AND TRANSVAGINAL ULTRASOUND OF PELVIS  TECHNIQUE: Both transabdominal and transvaginal ultrasound examinations of the pelvis were performed. Transabdominal technique was performed for global imaging of the pelvis including uterus, ovaries, adnexal regions, and pelvic cul-de-sac. It was necessary to proceed with endovaginal exam following the transabdominal exam to visualize the endometrium.  COMPARISON:  None  FINDINGS: Uterus  Measurements: 11.0 x 6.1 x 5.1 cm. No fibroids or other mass visualized.  Endometrium  Thickness: 15 mm.  No focal abnormality visualized.  Right ovary  Measurements: 3.4 x 3.3 x 2.6 cm. 2.1 cm hypoechoic area with fairly homogeneous low level echoes (image 39). No associated vascularity.  Left ovary  Measurements: 2.5 x 1.4 x 2.9 cm. Normal appearance/no adnexal mass.  Other findings  Small volume simple appearing pelvic free fluid.  IMPRESSION: 1. 2.1 cm complex cyst of the right ovary with low level echoes, such as hemorrhagic cyst or  small endometrioma. Follow-up with ultrasound in 6-12 weeks. 2. Otherwise negative sonographic appearance of the pelvis. 3. The above recommendation follows the consensus statement: Management of Asymptomatic Ovarian and Other Adnexal Cysts Imaged at US: Society of Radiologists in Ultrasound Consensus Conference Statement. Radiology 2010; [phone].   Electronically Signed   By: Odessa FlemingH  Hall M.D.   On: 09/05/2014 14:41     EKG Interpretation None      MDM   Final diagnoses:  PID (acute pelvic inflammatory disease)  Bacterial vaginosis  Cyst of right ovary      Pt presenting with lower abdominal pain and cramping.  Pelvic exam reveals + cmt and uterine tenderness.  Pelvic ultrasound shows right  adnexal cyst- pt is not especially tender on right side- this may be contributing to her pain, but also has many WBCs on wet prep and with CMT will treat for PID.  Pt given rocephin, d/c home with doxycycline- also many clue cells will treat for BV as well.  Pt advised to f/u with gynecology and will need repeat ultrasound in 6 weeks.  Discharged with strict return precautions.  Pt agreeable with plan.  Jerelyn ScottMartha Linker, MD 09/06/14 [phone]

## 2014-09-06 ENCOUNTER — Other Ambulatory Visit (HOSPITAL_COMMUNITY): Payer: Self-pay | Admitting: Family Medicine

## 2014-09-06 ENCOUNTER — Other Ambulatory Visit (HOSPITAL_COMMUNITY): Payer: Self-pay

## 2014-09-06 DIAGNOSIS — Z1231 Encounter for screening mammogram for malignant neoplasm of breast: Secondary | ICD-10-CM

## 2014-09-06 LAB — GC/CHLAMYDIA PROBE AMP (~~LOC~~) NOT AT ARMC
Chlamydia: NEGATIVE
Neisseria Gonorrhea: NEGATIVE

## 2014-09-06 LAB — URINE CULTURE

## 2014-09-12 ENCOUNTER — Ambulatory Visit (INDEPENDENT_AMBULATORY_CARE_PROVIDER_SITE_OTHER): Payer: Self-pay | Admitting: Obstetrics & Gynecology

## 2014-09-12 ENCOUNTER — Encounter: Payer: Self-pay | Admitting: Obstetrics & Gynecology

## 2014-09-12 ENCOUNTER — Ambulatory Visit (HOSPITAL_COMMUNITY)
Admission: RE | Admit: 2014-09-12 | Discharge: 2014-09-12 | Disposition: A | Discharge status: Home / Self Care | Payer: BLUE CROSS/BLUE SHIELD | Source: Ambulatory Visit | Attending: Family Medicine | Admitting: Family Medicine

## 2014-09-12 ENCOUNTER — Other Ambulatory Visit (HOSPITAL_COMMUNITY): Payer: Self-pay | Admitting: Family Medicine

## 2014-09-12 VITALS — BP 132/83 | HR 72 | Temp 97.9°F | Resp 20 | Ht 62.0 in | Wt 276.0 lb

## 2014-09-12 DIAGNOSIS — N8320 Unspecified ovarian cysts: Secondary | ICD-10-CM

## 2014-09-12 DIAGNOSIS — N83201 Unspecified ovarian cyst, right side: Secondary | ICD-10-CM

## 2014-09-12 DIAGNOSIS — Z1231 Encounter for screening mammogram for malignant neoplasm of breast: Secondary | ICD-10-CM | POA: Insufficient documentation

## 2014-09-12 MED ORDER — NAPROXEN 500 MG PO TABS: 500.0000 mg | ORAL_TABLET | Freq: Two times a day (BID) | ORAL | Status: DC

## 2014-09-12 NOTE — Progress Notes (Signed)
CLINIC ENCOUNTER NOTE  History:  49 y.o. [id number] here today for follow up of right ovarian cyst and presumptive PID (wet prep showed BV and numerous WBCs) diagnosed during ED visit for pelvic pain on 09/05/14.  Patient still reports having some pelvic pain, episodic in nature, alleviated by Percocet.  No other associated symptoms.  She has regular menstrual periods.   History reviewed. No pertinent past medical history.  Past Surgical History  Procedure Laterality Date  . Appendectomy    . Cesarean section      1987, 1992, 1995, 2005  . Tubal ligation  2005    The following portions of the patient's history were reviewed and updated as appropriate: allergies, current medications, past family history, past medical history, past social history, past surgical history and problem list.   Health Maintenance:  Normal pap in 2014.  Normal mammogram on 2012, scheduled for next one later today.   Review of Systems:  Gastrointestinal: positive for abdominal pain and dyspepsia/burning in stomach occasionally, loss of appetite Comprehensive review of systems was otherwise negative.  Objective:  Physical Exam BP 132/83 mmHg  Pulse 72  Temp(Src) 97.9 F (36.6 C) (Oral)  Resp 20  Ht 5\' 2"  (1.575 m)  Wt 276 lb (125.193 kg)  BMI 50.47 kg/m2  LMP 08/12/2014 CONSTITUTIONAL: Well-developed, well-nourished female in no acute distress.  HENT:  Normocephalic, atraumatic, External right and left ear normal. Oropharynx is clear and moist EYES: Conjunctivae and EOM are normal. Pupils are equal, round, and reactive to light. No scleral icterus.  NECK: Normal range of motion, supple, no masses SKIN: Skin is warm and dry. No rash noted. Not diaphoretic. No erythema. No pallor. NEUROLGIC: Alert and oriented to person, place, and time. Normal reflexes, muscle tone coordination. No cranial nerve deficit noted. PSYCHIATRIC: Normal mood and affect. Normal behavior. Normal judgment and thought  content. CARDIOVASCULAR: Normal heart rate noted RESPIRATORY: Effort and breath sounds normal, no problems with respiration noted ABDOMEN: Soft, obese, no distention noted.  Mild lower abdominal tenderness, L>R, no rebound or guarding.  PELVIC: Deferred MUSCULOSKELETAL: Normal range of motion. No edema and no tenderness.  Labs and Imaging [place] Transvaginal Non-ob  09/05/2014   CLINICAL DATA:  49 year old female with intermittent pelvic pain for 3 months. She 6 P 4. LMP 08/07/2014. Initial encounter.  EXAM: TRANSABDOMINAL AND TRANSVAGINAL ULTRASOUND OF PELVIS  TECHNIQUE: Both transabdominal and transvaginal ultrasound examinations of the pelvis were performed. Transabdominal technique was performed for global imaging of the pelvis including uterus, ovaries, adnexal regions, and pelvic cul-de-sac. It was necessary to proceed with endovaginal exam following the transabdominal exam to visualize the endometrium.  COMPARISON:  None  FINDINGS: Uterus  Measurements: 11.0 x 6.1 x 5.1 cm. No fibroids or other mass visualized.  Endometrium  Thickness: 15 mm.  No focal abnormality visualized.  Right ovary  Measurements: 3.4 x 3.3 x 2.6 cm. 2.1 cm hypoechoic area with fairly homogeneous low level echoes (image 39). No associated vascularity.  Left ovary  Measurements: 2.5 x 1.4 x 2.9 cm. Normal appearance/no adnexal mass.  Other findings  Small volume simple appearing pelvic free fluid.  IMPRESSION: 1. 2.1 cm complex cyst of the right ovary with low level echoes, such as hemorrhagic cyst or small endometrioma. Follow-up with ultrasound in 6-12 weeks. 2. Otherwise negative sonographic appearance of the pelvis. 3. The above recommendation follows the consensus statement: Management of Asymptomatic Ovarian and Other Adnexal Cysts Imaged at US: Society of Radiologists in Ultrasound Consensus Conference Statement. Radiology  2010; [phone]; [id number].   Electronically Signed   By: Odessa FlemingH  Hall M.D.   On: 09/05/2014 14:41   [place] Pelvis  Complete  09/05/2014   CLINICAL DATA:  49 year old female with intermittent pelvic pain for 3 months. She 6 P 4. LMP 08/07/2014. Initial encounter.  EXAM: TRANSABDOMINAL AND TRANSVAGINAL ULTRASOUND OF PELVIS  TECHNIQUE: Both transabdominal and transvaginal ultrasound examinations of the pelvis were performed. Transabdominal technique was performed for global imaging of the pelvis including uterus, ovaries, adnexal regions, and pelvic cul-de-sac. It was necessary to proceed with endovaginal exam following the transabdominal exam to visualize the endometrium.  COMPARISON:  None  FINDINGS: Uterus  Measurements: 11.0 x 6.1 x 5.1 cm. No fibroids or other mass visualized.  Endometrium  Thickness: 15 mm.  No focal abnormality visualized.  Right ovary  Measurements: 3.4 x 3.3 x 2.6 cm. 2.1 cm hypoechoic area with fairly homogeneous low level echoes (image 39). No associated vascularity.  Left ovary  Measurements: 2.5 x 1.4 x 2.9 cm. Normal appearance/no adnexal mass.  Other findings  Small volume simple appearing pelvic free fluid.  IMPRESSION: 1. 2.1 cm complex cyst of the right ovary with low level echoes, such as hemorrhagic cyst or small endometrioma. Follow-up with ultrasound in 6-12 weeks. 2. Otherwise negative sonographic appearance of the pelvis. 3. The above recommendation follows the consensus statement: Management of Asymptomatic Ovarian and Other Adnexal Cysts Imaged at US: Society of Radiologists in Ultrasound Consensus Conference Statement. Radiology 2010; [id number].   Electronically Signed   By: Odessa FlemingH  Hall M.D.   On: 09/05/2014 14:41    Assessment & Plan:  Follow up ultrasound ordered for patient Naproxen prescribed as needed for pain; continue antibiotics as prescribed but patient told she did not have concrete evidence of PID. Follow up with PCP for GI issues Routine preventative health maintenance measures emphasized.   Jaynie CollinsUGONNA  Tobby Fawcett, MD, FACOG Attending Obstetrician & Gynecologist Center for  [facility], [facility]

## 2014-09-12 NOTE — Patient Instructions (Signed)
[facility]  Address: [address], [place], [place] [address]  [phone]   Preventive Care for Adults A healthy lifestyle and preventive care can promote health and wellness. Preventive health guidelines for women include the following key practices.  A routine yearly physical is a good way to check with your health care provider about your health and preventive screening. It is a chance to share any concerns and updates on your health and to receive a thorough exam.  Visit your dentist for a routine exam and preventive care every 6 months. Brush your teeth twice a day and floss once a day. Good oral hygiene prevents tooth decay and gum disease.  The frequency of eye exams is based on your age, health, family medical history, use of contact lenses, and other factors. Follow your health care provider's recommendations for frequency of eye exams.  Eat a healthy diet. Foods like vegetables, fruits, whole grains, low-fat dairy products, and lean protein foods contain the nutrients you need without too many calories. Decrease your intake of foods high in solid fats, added sugars, and salt. Eat the right amount of calories for you.Get information about a proper diet from your health care provider, if necessary.  Regular physical exercise is one of the most important things you can do for your health. Most adults should get at least 150 minutes of moderate-intensity exercise (any activity that increases your heart rate and causes you to sweat) each week. In addition, most adults need muscle-strengthening exercises on 2 or more days a week.  Maintain a healthy weight. The body mass index (BMI) is a screening tool to identify possible weight problems. It provides an estimate of body fat based on height and weight. Your health care provider can find your BMI and can help you achieve or maintain a healthy weight.For adults 20 years and older:  A BMI below  18.5 is considered underweight.  A BMI of 18.5 to 24.9 is normal.  A BMI of 25 to 29.9 is considered overweight.  A BMI of 30 and above is considered obese.  Maintain normal blood lipids and cholesterol levels by exercising and minimizing your intake of saturated fat. Eat a balanced diet with plenty of fruit and vegetables. Blood tests for lipids and cholesterol should begin at age 24 and be repeated every 5 years. If your lipid or cholesterol levels are high, you are over 50, or you are at high risk for heart disease, you may need your cholesterol levels checked more frequently.Ongoing high lipid and cholesterol levels should be treated with medicines if diet and exercise are not working.  If you smoke, find out from your health care provider how to quit. If you do not use tobacco, do not start.  Lung cancer screening is recommended for adults aged 57-80 years who are at high risk for developing lung cancer because of a history of smoking. A yearly low-dose CT scan of the lungs is recommended for people who have at least a 30-pack-year history of smoking and are a current smoker or have quit within the past 15 years. A pack year of smoking is smoking an average of 1 pack of cigarettes a day for 1 year (for example: 1 pack a day for 30 years or 2 packs a day for 15 years). Yearly screening should continue until the smoker has stopped smoking for at least 15 years. Yearly screening should be stopped for people who develop a health problem that  would prevent them from having lung cancer treatment.  If you are pregnant, do not drink alcohol. If you are breastfeeding, be very cautious about drinking alcohol. If you are not pregnant and choose to drink alcohol, do not have more than 1 drink per day. One drink is considered to be 12 ounces (355 mL) of beer, 5 ounces (148 mL) of wine, or 1.5 ounces (44 mL) of liquor.  Avoid use of street drugs. Do not share needles with anyone. Ask for help if you need  support or instructions about stopping the use of drugs.  High blood pressure causes heart disease and increases the risk of stroke. Your blood pressure should be checked at least every 1 to 2 years. Ongoing high blood pressure should be treated with medicines if weight loss and exercise do not work.  If you are 36-52 years old, ask your health care provider if you should take aspirin to prevent strokes.  Diabetes screening involves taking a blood sample to check your fasting blood sugar level. This should be done once every 3 years, after age 24, if you are within normal weight and without risk factors for diabetes. Testing should be considered at a younger age or be carried out more frequently if you are overweight and have at least 1 risk factor for diabetes.  Breast cancer screening is essential preventive care for women. You should practice "breast self-awareness." This means understanding the normal appearance and feel of your breasts and may include breast self-examination. Any changes detected, no matter how small, should be reported to a health care provider. Women in their 72s and 30s should have a clinical breast exam (CBE) by a health care provider as part of a regular health exam every 1 to 3 years. After age 19, women should have a CBE every year. Starting at age 44, women should consider having a mammogram (breast X-ray test) every year. Women who have a family history of breast cancer should talk to their health care provider about genetic screening. Women at a high risk of breast cancer should talk to their health care providers about having an MRI and a mammogram every year.  Breast cancer gene (BRCA)-related cancer risk assessment is recommended for women who have family members with BRCA-related cancers. BRCA-related cancers include breast, ovarian, tubal, and peritoneal cancers. Having family members with these cancers may be associated with an increased risk for harmful changes  (mutations) in the breast cancer genes BRCA1 and BRCA2. Results of the assessment will determine the need for genetic counseling and BRCA1 and BRCA2 testing.  Routine pelvic exams to screen for cancer are no longer recommended for nonpregnant women who are considered low risk for cancer of the pelvic organs (ovaries, uterus, and vagina) and who do not have symptoms. Ask your health care provider if a screening pelvic exam is right for you.  If you have had past treatment for cervical cancer or a condition that could lead to cancer, you need Pap tests and screening for cancer for at least 20 years after your treatment. If Pap tests have been discontinued, your risk factors (such as having a new sexual partner) need to be reassessed to determine if screening should be resumed. Some women have medical problems that increase the chance of getting cervical cancer. In these cases, your health care provider may recommend more frequent screening and Pap tests.  The HPV test is an additional test that may be used for cervical cancer screening. The HPV test looks  for the virus that can cause the cell changes on the cervix. The cells collected during the Pap test can be tested for HPV. The HPV test could be used to screen women aged 30 years and older, and should be used in women of any age who have unclear Pap test results. After the age of 30, women should have HPV testing at the same frequency as a Pap test.  Colorectal cancer can be detected and often prevented. Most routine colorectal cancer screening begins at the age of 50 years and continues through age 75 years. However, your health care provider may recommend screening at an earlier age if you have risk factors for colon cancer. On a yearly basis, your health care provider may provide home test kits to check for hidden blood in the stool. Use of a small camera at the end of a tube, to directly examine the colon (sigmoidoscopy or colonoscopy), can detect the  earliest forms of colorectal cancer. Talk to your health care provider about this at age 50, when routine screening begins. Direct exam of the colon should be repeated every 5-10 years through age 75 years, unless early forms of pre-cancerous polyps or small growths are found.  People who are at an increased risk for hepatitis B should be screened for this virus. You are considered at high risk for hepatitis B if:  You were born in a country where hepatitis B occurs often. Talk with your health care provider about which countries are considered high risk.  Your parents were born in a high-risk country and you have not received a shot to protect against hepatitis B (hepatitis B vaccine).  You have HIV or AIDS.  You use needles to inject street drugs.  You live with, or have sex with, someone who has hepatitis B.  You get hemodialysis treatment.  You take certain medicines for conditions like cancer, organ transplantation, and autoimmune conditions.  Hepatitis C blood testing is recommended for all people born from 1945 through 1965 and any individual with known risks for hepatitis C.  Practice safe sex. Use condoms and avoid high-risk sexual practices to reduce the spread of sexually transmitted infections (STIs). STIs include gonorrhea, chlamydia, syphilis, trichomonas, herpes, HPV, and human immunodeficiency virus (HIV). Herpes, HIV, and HPV are viral illnesses that have no cure. They can result in disability, cancer, and death.  You should be screened for sexually transmitted illnesses (STIs) including gonorrhea and chlamydia if:  You are sexually active and are younger than 24 years.  You are older than 24 years and your health care provider tells you that you are at risk for this type of infection.  Your sexual activity has changed since you were last screened and you are at an increased risk for chlamydia or gonorrhea. Ask your health care provider if you are at risk.  If you are  at risk of being infected with HIV, it is recommended that you take a prescription medicine daily to prevent HIV infection. This is called preexposure prophylaxis (PrEP). You are considered at risk if:  You are a heterosexual woman, are sexually active, and are at increased risk for HIV infection.  You take drugs by injection.  You are sexually active with a partner who has HIV.  Talk with your health care provider about whether you are at high risk of being infected with HIV. If you choose to begin PrEP, you should first be tested for HIV. You should then be tested every 3 months   for as long as you are taking PrEP.  Osteoporosis is a disease in which the bones lose minerals and strength with aging. This can result in serious bone fractures or breaks. The risk of osteoporosis can be identified using a bone density scan. Women ages 65 years and over and women at risk for fractures or osteoporosis should discuss screening with their health care providers. Ask your health care provider whether you should take a calcium supplement or vitamin D to reduce the rate of osteoporosis.  Menopause can be associated with physical symptoms and risks. Hormone replacement therapy is available to decrease symptoms and risks. You should talk to your health care provider about whether hormone replacement therapy is right for you.  Use sunscreen. Apply sunscreen liberally and repeatedly throughout the day. You should seek shade when your shadow is shorter than you. Protect yourself by wearing long sleeves, pants, a wide-brimmed hat, and sunglasses year round, whenever you are outdoors.  Once a month, do a whole body skin exam, using a mirror to look at the skin on your back. Tell your health care provider of new moles, moles that have irregular borders, moles that are larger than a pencil eraser, or moles that have changed in shape or color.  Stay current with required vaccines (immunizations).  Influenza vaccine.  All adults should be immunized every year.  Tetanus, diphtheria, and acellular pertussis (Td, Tdap) vaccine. Pregnant women should receive 1 dose of Tdap vaccine during each pregnancy. The dose should be obtained regardless of the length of time since the last dose. Immunization is preferred during the 27th-36th week of gestation. An adult who has not previously received Tdap or who does not know her vaccine status should receive 1 dose of Tdap. This initial dose should be followed by tetanus and diphtheria toxoids (Td) booster doses every 10 years. Adults with an unknown or incomplete history of completing a 3-dose immunization series with Td-containing vaccines should begin or complete a primary immunization series including a Tdap dose. Adults should receive a Td booster every 10 years.  Varicella vaccine. An adult without evidence of immunity to varicella should receive 2 doses or a second dose if she has previously received 1 dose. Pregnant females who do not have evidence of immunity should receive the first dose after pregnancy. This first dose should be obtained before leaving the health care facility. The second dose should be obtained 4-8 weeks after the first dose.  Human papillomavirus (HPV) vaccine. Females aged 13-26 years who have not received the vaccine previously should obtain the 3-dose series. The vaccine is not recommended for use in pregnant females. However, pregnancy testing is not needed before receiving a dose. If a female is found to be pregnant after receiving a dose, no treatment is needed. In that case, the remaining doses should be delayed until after the pregnancy. Immunization is recommended for any person with an immunocompromised condition through the age of 26 years if she did not get any or all doses earlier. During the 3-dose series, the second dose should be obtained 4-8 weeks after the first dose. The third dose should be obtained 24 weeks after the first dose and 16  weeks after the second dose.  Zoster vaccine. One dose is recommended for adults aged 60 years or older unless certain conditions are present.  Measles, mumps, and rubella (MMR) vaccine. Adults born before 1957 generally are considered immune to measles and mumps. Adults born in 1957 or later should have 1 or   more doses of MMR vaccine unless there is a contraindication to the vaccine or there is laboratory evidence of immunity to each of the three diseases. A routine second dose of MMR vaccine should be obtained at least 28 days after the first dose for students attending postsecondary schools, health care workers, or international travelers. People who received inactivated measles vaccine or an unknown type of measles vaccine during 1963-1967 should receive 2 doses of MMR vaccine. People who received inactivated mumps vaccine or an unknown type of mumps vaccine before 1979 and are at high risk for mumps infection should consider immunization with 2 doses of MMR vaccine. For females of childbearing age, rubella immunity should be determined. If there is no evidence of immunity, females who are not pregnant should be vaccinated. If there is no evidence of immunity, females who are pregnant should delay immunization until after pregnancy. Unvaccinated health care workers born before 1957 who lack laboratory evidence of measles, mumps, or rubella immunity or laboratory confirmation of disease should consider measles and mumps immunization with 2 doses of MMR vaccine or rubella immunization with 1 dose of MMR vaccine.  Pneumococcal 13-valent conjugate (PCV13) vaccine. When indicated, a person who is uncertain of her immunization history and has no record of immunization should receive the PCV13 vaccine. An adult aged 19 years or older who has certain medical conditions and has not been previously immunized should receive 1 dose of PCV13 vaccine. This PCV13 should be followed with a dose of pneumococcal  polysaccharide (PPSV23) vaccine. The PPSV23 vaccine dose should be obtained at least 8 weeks after the dose of PCV13 vaccine. An adult aged 19 years or older who has certain medical conditions and previously received 1 or more doses of PPSV23 vaccine should receive 1 dose of PCV13. The PCV13 vaccine dose should be obtained 1 or more years after the last PPSV23 vaccine dose.  Pneumococcal polysaccharide (PPSV23) vaccine. When PCV13 is also indicated, PCV13 should be obtained first. All adults aged 65 years and older should be immunized. An adult younger than age 65 years who has certain medical conditions should be immunized. Any person who resides in a nursing home or long-term care facility should be immunized. An adult smoker should be immunized. People with an immunocompromised condition and certain other conditions should receive both PCV13 and PPSV23 vaccines. People with human immunodeficiency virus (HIV) infection should be immunized as soon as possible after diagnosis. Immunization during chemotherapy or radiation therapy should be avoided. Routine use of PPSV23 vaccine is not recommended for American Indians, [place] Natives, or people younger than 65 years unless there are medical conditions that require PPSV23 vaccine. When indicated, people who have unknown immunization and have no record of immunization should receive PPSV23 vaccine. One-time revaccination 5 years after the first dose of PPSV23 is recommended for people aged 19-64 years who have chronic kidney failure, nephrotic syndrome, asplenia, or immunocompromised conditions. People who received 1-2 doses of PPSV23 before age 65 years should receive another dose of PPSV23 vaccine at age 65 years or later if at least 5 years have passed since the previous dose. Doses of PPSV23 are not needed for people immunized with PPSV23 at or after age 65 years.  Meningococcal vaccine. Adults with asplenia or persistent complement component deficiencies  should receive 2 doses of quadrivalent meningococcal conjugate (MenACWY-D) vaccine. The doses should be obtained at least 2 months apart. Microbiologists working with certain meningococcal bacteria, military recruits, people at risk during an outbreak, and people who travel   to or live in countries with a high rate of meningitis should be immunized. A first-year college student up through age 21 years who is living in a residence hall should receive a dose if she did not receive a dose on or after her 16th birthday. Adults who have certain high-risk conditions should receive one or more doses of vaccine.  Hepatitis A vaccine. Adults who wish to be protected from this disease, have certain high-risk conditions, work with hepatitis A-infected animals, work in hepatitis A research labs, or travel to or work in countries with a high rate of hepatitis A should be immunized. Adults who were previously unvaccinated and who anticipate close contact with an international adoptee during the first 60 days after arrival in the [place] from a country with a high rate of hepatitis A should be immunized.  Hepatitis B vaccine. Adults who wish to be protected from this disease, have certain high-risk conditions, may be exposed to blood or other infectious body fluids, are household contacts or sex partners of hepatitis B positive people, are clients or workers in certain care facilities, or travel to or work in countries with a high rate of hepatitis B should be immunized.  Haemophilus influenzae type b (Hib) vaccine. A previously unvaccinated person with asplenia or sickle cell disease or having a scheduled splenectomy should receive 1 dose of Hib vaccine. Regardless of previous immunization, a recipient of a hematopoietic stem cell transplant should receive a 3-dose series 6-12 months after her successful transplant. Hib vaccine is not recommended for adults with HIV infection. Preventive Services / Frequency Ages 19  to 39 years  Blood pressure check.** / Every 1 to 2 years.  Lipid and cholesterol check.** / Every 5 years beginning at age 20.  Clinical breast exam.** / Every 3 years for women in their 20s and 30s.  BRCA-related cancer risk assessment.** / For women who have family members with a BRCA-related cancer (breast, ovarian, tubal, or peritoneal cancers).  Pap test.** / Every 2 years from ages 21 through 29. Every 3 years starting at age 30 through age 65 or 70 with a history of 3 consecutive normal Pap tests.  HPV screening.** / Every 3 years from ages 30 through ages 65 to 70 with a history of 3 consecutive normal Pap tests.  Hepatitis C blood test.** / For any individual with known risks for hepatitis C.  Skin self-exam. / Monthly.  Influenza vaccine. / Every year.  Tetanus, diphtheria, and acellular pertussis (Tdap, Td) vaccine.** / Consult your health care provider. Pregnant women should receive 1 dose of Tdap vaccine during each pregnancy. 1 dose of Td every 10 years.  Varicella vaccine.** / Consult your health care provider. Pregnant females who do not have evidence of immunity should receive the first dose after pregnancy.  HPV vaccine. / 3 doses over 6 months, if 26 and younger. The vaccine is not recommended for use in pregnant females. However, pregnancy testing is not needed before receiving a dose.  Measles, mumps, rubella (MMR) vaccine.** / You need at least 1 dose of MMR if you were born in 1957 or later. You may also need a 2nd dose. For females of childbearing age, rubella immunity should be determined. If there is no evidence of immunity, females who are not pregnant should be vaccinated. If there is no evidence of immunity, females who are pregnant should delay immunization until after pregnancy.  Pneumococcal 13-valent conjugate (PCV13) vaccine.** / Consult your health care provider.  Pneumococcal   polysaccharide (PPSV23) vaccine.** / 1 to 2 doses if you smoke cigarettes  or if you have certain conditions.  Meningococcal vaccine.** / 1 dose if you are age 19 to 21 years and a first-year college student living in a residence hall, or have one of several medical conditions, you need to get vaccinated against meningococcal disease. You may also need additional booster doses.  Hepatitis A vaccine.** / Consult your health care provider.  Hepatitis B vaccine.** / Consult your health care provider.  Haemophilus influenzae type b (Hib) vaccine.** / Consult your health care provider. Ages 40 to 64 years  Blood pressure check.** / Every 1 to 2 years.  Lipid and cholesterol check.** / Every 5 years beginning at age 20 years.  Lung cancer screening. / Every year if you are aged 55-80 years and have a 30-pack-year history of smoking and currently smoke or have quit within the past 15 years. Yearly screening is stopped once you have quit smoking for at least 15 years or develop a health problem that would prevent you from having lung cancer treatment.  Clinical breast exam.** / Every year after age 40 years.  BRCA-related cancer risk assessment.** / For women who have family members with a BRCA-related cancer (breast, ovarian, tubal, or peritoneal cancers).  Mammogram.** / Every year beginning at age 40 years and continuing for as long as you are in good health. Consult with your health care provider.  Pap test.** / Every 3 years starting at age 30 years through age 65 or 70 years with a history of 3 consecutive normal Pap tests.  HPV screening.** / Every 3 years from ages 30 years through ages 65 to 70 years with a history of 3 consecutive normal Pap tests.  Fecal occult blood test (FOBT) of stool. / Every year beginning at age 50 years and continuing until age 75 years. You may not need to do this test if you get a colonoscopy every 10 years.  Flexible sigmoidoscopy or colonoscopy.** / Every 5 years for a flexible sigmoidoscopy or every 10 years for a colonoscopy  beginning at age 50 years and continuing until age 75 years.  Hepatitis C blood test.** / For all people born from 1945 through 1965 and any individual with known risks for hepatitis C.  Skin self-exam. / Monthly.  Influenza vaccine. / Every year.  Tetanus, diphtheria, and acellular pertussis (Tdap/Td) vaccine.** / Consult your health care provider. Pregnant women should receive 1 dose of Tdap vaccine during each pregnancy. 1 dose of Td every 10 years.  Varicella vaccine.** / Consult your health care provider. Pregnant females who do not have evidence of immunity should receive the first dose after pregnancy.  Zoster vaccine.** / 1 dose for adults aged 60 years or older.  Measles, mumps, rubella (MMR) vaccine.** / You need at least 1 dose of MMR if you were born in 1957 or later. You may also need a 2nd dose. For females of childbearing age, rubella immunity should be determined. If there is no evidence of immunity, females who are not pregnant should be vaccinated. If there is no evidence of immunity, females who are pregnant should delay immunization until after pregnancy.  Pneumococcal 13-valent conjugate (PCV13) vaccine.** / Consult your health care provider.  Pneumococcal polysaccharide (PPSV23) vaccine.** / 1 to 2 doses if you smoke cigarettes or if you have certain conditions.  Meningococcal vaccine.** / Consult your health care provider.  Hepatitis A vaccine.** / Consult your health care provider.  Hepatitis B   vaccine.** / Consult your health care provider.  Haemophilus influenzae type b (Hib) vaccine.** / Consult your health care provider. Ages 4 years and over  Blood pressure check.** / Every 1 to 2 years.  Lipid and cholesterol check.** / Every 5 years beginning at age 43 years.  Lung cancer screening. / Every year if you are aged 3-80 years and have a 30-pack-year history of smoking and currently smoke or have quit within the past 15 years. Yearly screening is stopped  once you have quit smoking for at least 15 years or develop a health problem that would prevent you from having lung cancer treatment.  Clinical breast exam.** / Every year after age 22 years.  BRCA-related cancer risk assessment.** / For women who have family members with a BRCA-related cancer (breast, ovarian, tubal, or peritoneal cancers).  Mammogram.** / Every year beginning at age 9 years and continuing for as long as you are in good health. Consult with your health care provider.  Pap test.** / Every 3 years starting at age 18 years through age 34 or 75 years with 3 consecutive normal Pap tests. Testing can be stopped between 65 and 70 years with 3 consecutive normal Pap tests and no abnormal Pap or HPV tests in the past 10 years.  HPV screening.** / Every 3 years from ages 48 years through ages 85 or 20 years with a history of 3 consecutive normal Pap tests. Testing can be stopped between 65 and 70 years with 3 consecutive normal Pap tests and no abnormal Pap or HPV tests in the past 10 years.  Fecal occult blood test (FOBT) of stool. / Every year beginning at age 80 years and continuing until age 48 years. You may not need to do this test if you get a colonoscopy every 10 years.  Flexible sigmoidoscopy or colonoscopy.** / Every 5 years for a flexible sigmoidoscopy or every 10 years for a colonoscopy beginning at age 94 years and continuing until age 72 years.  Hepatitis C blood test.** / For all people born from [birth year] through 1965 and any individual with known risks for hepatitis C.  Osteoporosis screening.** / A one-time screening for women ages 42 years and over and women at risk for fractures or osteoporosis.  Skin self-exam. / Monthly.  Influenza vaccine. / Every year.  Tetanus, diphtheria, and acellular pertussis (Tdap/Td) vaccine.** / 1 dose of Td every 10 years.  Varicella vaccine.** / Consult your health care provider.  Zoster vaccine.** / 1 dose for adults aged 6 years  or older.  Pneumococcal 13-valent conjugate (PCV13) vaccine.** / Consult your health care provider.  Pneumococcal polysaccharide (PPSV23) vaccine.** / 1 dose for all adults aged 58 years and older.  Meningococcal vaccine.** / Consult your health care provider.  Hepatitis A vaccine.** / Consult your health care provider.  Hepatitis B vaccine.** / Consult your health care provider.  Haemophilus influenzae type b (Hib) vaccine.** / Consult your health care provider. ** Family history and personal history of risk and conditions may change your health care provider's recommendations. Document Released: 06/09/2001 Document Revised: 08/28/2013 Document Reviewed: 09/08/2010 [facility] Patient Information 2015 [place], [place]. This information is not intended to replace advice given to you by your health care provider. Make sure you discuss any questions you have with your health care provider.

## 2014-09-14 ENCOUNTER — Telehealth: Payer: Self-pay | Admitting: General Practice

## 2014-09-14 ENCOUNTER — Other Ambulatory Visit: Payer: Self-pay | Admitting: Family Medicine

## 2014-09-14 DIAGNOSIS — R928 Other abnormal and inconclusive findings on diagnostic imaging of breast: Secondary | ICD-10-CM

## 2014-09-14 DIAGNOSIS — N83201 Unspecified ovarian cyst, right side: Secondary | ICD-10-CM

## 2014-09-14 MED ORDER — NAPROXEN 500 MG PO TABS: 500.0000 mg | ORAL_TABLET | Freq: Two times a day (BID) | ORAL | Status: DC

## 2014-09-14 NOTE — Telephone Encounter (Signed)
Patient called and left message stating she saw Dr Macon LargeAnyanwu on Wednesday and still had percocet at that time. States now she is out of the percocet and would like a refill because she is still having pain. States she uses [facility] on [address]. Called patient back stating I am returning her call and asked about her naproxen Rx. Patient states that prescription [facility] [address] and she needs it at [facility]. Patient states that probably won't be enough and she needs the percocet. Told patient I will send the naproxen there to [facility] and to try taking that over the next week, one with breakfast and one with dinner to see if that alleviates the pain. Patient verbalized understanding and had no other questions

## 2014-09-18 ENCOUNTER — Telehealth: Payer: Self-pay

## 2014-09-18 NOTE — Telephone Encounter (Signed)
Patient called sating she was seen by Dr. Macon LargeAnyanwu last week and given Naprosyn for pain. Reports it is not working and would like something stronger. Message sent to Dr. Macon LargeAnyanwu. Awaiting response.

## 2014-09-19 NOTE — Telephone Encounter (Addendum)
Per Dr. Macon LargeAnyanwu, patient can take extra strength tylenol. NO narcotic to be prescribed. Small cyst. Called patient and informed her of this. Patient verbalized understanding. No questions or concerns.

## 2014-09-20 ENCOUNTER — Ambulatory Visit
Admission: RE | Admit: 2014-09-20 | Discharge: 2014-09-20 | Disposition: A | Discharge status: Home / Self Care | Payer: BLUE CROSS/BLUE SHIELD | Source: Ambulatory Visit | Attending: Family Medicine | Admitting: Family Medicine

## 2014-09-20 DIAGNOSIS — R928 Other abnormal and inconclusive findings on diagnostic imaging of breast: Secondary | ICD-10-CM

## 2014-10-24 ENCOUNTER — Ambulatory Visit (HOSPITAL_COMMUNITY)
Admission: RE | Admit: 2014-10-24 | Discharge: 2014-10-24 | Disposition: A | Discharge status: Home / Self Care | Payer: BLUE CROSS/BLUE SHIELD | Source: Ambulatory Visit | Attending: Obstetrics & Gynecology | Admitting: Obstetrics & Gynecology

## 2014-10-24 ENCOUNTER — Telehealth: Payer: Self-pay

## 2014-10-24 DIAGNOSIS — N8320 Unspecified ovarian cysts: Secondary | ICD-10-CM | POA: Diagnosis present

## 2014-10-24 DIAGNOSIS — N83201 Unspecified ovarian cyst, right side: Secondary | ICD-10-CM

## 2014-10-24 NOTE — Telephone Encounter (Signed)
Attempted to contact patient regarding results of U/S. No answer. Left message stating we are calling with results, please call clinic. Patient to be informed right ovarian cyst has been resolved.

## 2014-10-24 NOTE — Telephone Encounter (Signed)
-----   Message from Dawn NewcomerUgonna A Anyanwu, MD sent at 10/24/2014 11:46 AM EDT ----- Resolved right ovarian cyst.  Please call to inform patient of results.

## 2014-10-26 NOTE — Telephone Encounter (Signed)
Called pt and informed pt that her US showed that her ovarian cyst has went away and that she can f/u for GYN issues as needed.  Pt stated understanding with no further questions.

## 2015-08-13 ENCOUNTER — Emergency Department (HOSPITAL_COMMUNITY)
Admission: EM | Admit: 2015-08-13 | Discharge: 2015-08-14 | Disposition: A | Discharge status: Home / Self Care | Payer: BLUE CROSS/BLUE SHIELD | Attending: Emergency Medicine | Admitting: Emergency Medicine

## 2015-08-13 ENCOUNTER — Encounter (HOSPITAL_COMMUNITY): Payer: Self-pay

## 2015-08-13 DIAGNOSIS — R030 Elevated blood-pressure reading, without diagnosis of hypertension: Secondary | ICD-10-CM | POA: Diagnosis present

## 2015-08-13 DIAGNOSIS — R51 Headache: Secondary | ICD-10-CM | POA: Insufficient documentation

## 2015-08-13 NOTE — ED Notes (Signed)
Pt reports onset 48 hours BP been elevated (checked at work 161/98), headache- constant, sees spots intermittantly, has to squint to read and nausea.  Pt took ASA last night with no relief, has been drinking vinager water today.  Pt has not been dx with hypertension.

## 2015-08-13 NOTE — ED Notes (Signed)
Pt stated they no longer wanted to wait and exited the ED.

## 2016-03-11 ENCOUNTER — Emergency Department (HOSPITAL_COMMUNITY)
Admission: EM | Admit: 2016-03-11 | Discharge: 2016-03-11 | Disposition: A | Discharge status: Home / Self Care | Payer: BLUE CROSS/BLUE SHIELD | Attending: Emergency Medicine | Admitting: Emergency Medicine

## 2016-03-11 ENCOUNTER — Encounter (HOSPITAL_COMMUNITY): Payer: Self-pay

## 2016-03-11 DIAGNOSIS — Y929 Unspecified place or not applicable: Secondary | ICD-10-CM | POA: Diagnosis not present

## 2016-03-11 DIAGNOSIS — Y939 Activity, unspecified: Secondary | ICD-10-CM | POA: Diagnosis not present

## 2016-03-11 DIAGNOSIS — W19XXXA Unspecified fall, initial encounter: Secondary | ICD-10-CM | POA: Insufficient documentation

## 2016-03-11 DIAGNOSIS — S39012A Strain of muscle, fascia and tendon of lower back, initial encounter: Secondary | ICD-10-CM | POA: Diagnosis not present

## 2016-03-11 DIAGNOSIS — Y999 Unspecified external cause status: Secondary | ICD-10-CM | POA: Diagnosis not present

## 2016-03-11 DIAGNOSIS — S3992XA Unspecified injury of lower back, initial encounter: Secondary | ICD-10-CM | POA: Diagnosis present

## 2016-03-11 MED ORDER — NAPROXEN 500 MG PO TABS: 500.0000 mg | ORAL_TABLET | Freq: Two times a day (BID) | ORAL | 0 refills | Status: DC

## 2016-03-11 MED ORDER — DEXAMETHASONE SODIUM PHOSPHATE 10 MG/ML IJ SOLN
10.0000 mg | Freq: Once | INTRAMUSCULAR | Status: AC
  Administered 2016-03-11: 10 mg via INTRAMUSCULAR
  Filled 2016-03-11: qty 1

## 2016-03-11 MED ORDER — CYCLOBENZAPRINE HCL 10 MG PO TABS: 10.0000 mg | ORAL_TABLET | Freq: Two times a day (BID) | ORAL | 0 refills | Status: DC | PRN

## 2016-03-11 MED ORDER — ACETAMINOPHEN 500 MG PO TABS
1000.0000 mg | ORAL_TABLET | Freq: Once | ORAL | Status: AC
  Administered 2016-03-11: 1000 mg via ORAL
  Filled 2016-03-11: qty 2

## 2016-03-11 MED ORDER — KETOROLAC TROMETHAMINE 60 MG/2ML IM SOLN
60.0000 mg | Freq: Once | INTRAMUSCULAR | Status: AC
  Administered 2016-03-11: 60 mg via INTRAMUSCULAR
  Filled 2016-03-11: qty 2

## 2016-03-11 MED ORDER — HYDROCODONE-ACETAMINOPHEN 5-325 MG PO TABS: 2.0000 | ORAL_TABLET | ORAL | 0 refills | Status: DC | PRN

## 2016-03-11 NOTE — Discharge Instructions (Signed)
SEEK IMMEDIATE MEDICAL ATTENTION IF: New numbness, tingling, weakness, or problem with the use of your arms or legs.  Severe back pain not relieved with medications.  Change in bowel or bladder control.  Increasing pain in any areas of the body (such as chest or abdominal pain).  Shortness of breath, dizziness or fainting.  Nausea (feeling sick to your stomach), vomiting, fever, or sweats.  

## 2016-03-11 NOTE — ED Provider Notes (Signed)
[facility]-EMERGENCY DEPT Provider Note   CSN: [id number] Arrival date & time: 03/11/16  1214   By signing my name below, I, Dawn Reilly, attest that this documentation has been prepared under the direction and in the presence of non-physician practitioner, Arthor CaptainAbigail Carlis Burnsworth, PA-C. Electronically Signed: Freida Busmaniana Reilly, Scribe. 03/11/2016. 1:21 PM.   History   Chief Complaint Chief Complaint  Patient presents with  . Back Pain     The history is provided by the patient. No language interpreter was used.    HPI Comments:  Dawn Reilly is a 50 y.o. female who presents to the Emergency Department complaining of constant, gradually worsening, lower back pain since yesterday AM. She notes her pain is occasionally sharp. Her pain is exacerbated with certain movements. She denies h/o same. She also denies dysuria, hematuria, bowel/bladder incontinence,  and urinary urgency/frequency. She has taken ASA and tylenol with minimal relief.   History reviewed. No pertinent past medical history.  There are no active problems to display for this patient.   Past Surgical History:  Procedure Laterality Date  . APPENDECTOMY    . CESAREAN SECTION     1987, 1992, 1995, 2005  . TUBAL LIGATION  2005    OB History    Gravida Para Term Preterm AB Living   6 4 4   2 4    SAB TAB Ectopic Multiple Live Births   1 1             Home Medications    Prior to Admission medications   Medication Sig Start Date End Date Taking? Authorizing Provider  doxycycline (VIBRAMYCIN) 100 MG capsule Take 1 capsule (100 mg total) by mouth 2 (two) times daily. 09/05/14   Jerelyn ScottMartha Linker, MD  metroNIDAZOLE (FLAGYL) 500 MG tablet Take 1 tablet (500 mg total) by mouth 2 (two) times daily. 09/05/14   Jerelyn ScottMartha Linker, MD  naproxen (NAPROSYN) 500 MG tablet Take 1 tablet (500 mg total) by mouth 2 (two) times daily with a meal. As needed for pain 09/14/14   Tereso NewcomerUgonna A Anyanwu, MD  oxyCODONE-acetaminophen (PERCOCET/ROXICET) 5-325  MG per tablet Take 1-2 tablets by mouth every 6 (six) hours as needed for severe pain. 09/05/14   Jerelyn ScottMartha Linker, MD    Family History Family History  Problem Relation Age of Onset  . Cancer Mother     pancreas  . Diabetes Mother   . Hypertension Mother   . Hernia Mother     Social History Social History  Substance Use Topics  . Smoking status: Never Smoker  . Smokeless tobacco: Not on file  . Alcohol use Yes     Comment: occasional     Allergies   Shellfish allergy   Review of Systems Review of Systems  Constitutional: Negative for fever.  Genitourinary: Negative for dysuria, frequency, hematuria and urgency.  Musculoskeletal: Positive for back pain.  Neurological: Negative for weakness and numbness.    Physical Exam Updated Vital Signs BP 143/85 (BP Location: Left Arm)   Pulse 74   Temp 97.9 F (36.6 C) (Oral)   Resp 18   SpO2 100%   Physical Exam  Constitutional: She is oriented to person, place, and time. She appears well-developed and well-nourished. No distress.  HENT:  Head: Normocephalic.  Eyes: Conjunctivae are normal.  Neck: Normal range of motion. Neck supple.  Cardiovascular: Normal rate.   Pulmonary/Chest: Effort normal.  Abdominal: She exhibits no distension.  Musculoskeletal:  No midline tenderness Bilateral lumbar paraspinal muscle spasms; Guarding all movement  of lumbar spine ROM is deferred due to acute spasms  Normal BLE strength and DTRs  Neurological: She is alert and oriented to person, place, and time.  Skin: Skin is warm and dry.  Psychiatric: She has a normal mood and affect.  Nursing note and vitals reviewed.    ED Treatments / Results  DIAGNOSTIC STUDIES:  Oxygen Saturation is 100% on RA, normal by my interpretation.    COORDINATION OF CARE:  1:17 PM Discussed treatment plan with pt at bedside and pt agreed to plan.  Labs (all labs ordered are listed, but only abnormal results are displayed) Labs Reviewed - No data  to display  EKG  EKG Interpretation None       Radiology No results found.  Procedures Procedures (including critical care time)  Medications Ordered in ED Medications  ketorolac (TORADOL) injection 60 mg (not administered)  dexamethasone (DECADRON) injection 10 mg (not administered)  acetaminophen (TYLENOL) tablet 1,000 mg (not administered)     Initial Impression / Assessment and Plan / ED Course  I have reviewed the triage vital signs and the nursing notes.  Pertinent labs & imaging results that were available during my care of the patient were reviewed by me and considered in my medical decision making (see chart for details).  Clinical Course       Patient with back pain.  No neurological deficits and normal neuro exam.  Patient is ambulatory.  No loss of bowel or bladder control.  No concern for cauda equina.  No fever, night sweats, weight loss, h/o cancer, IVDA, no recent procedure to back. No urinary symptoms suggestive of UTI.  Supportive care and return precaution discussed. Appears safe for discharge at this time. Follow up as indicated in discharge paperwork.   Final Clinical Impressions(s) / ED Diagnoses   Final diagnoses:  Strain of lumbar region, initial encounter    New Prescriptions New Prescriptions   CYCLOBENZAPRINE (FLEXERIL) 10 MG TABLET    Take 1 tablet (10 mg total) by mouth 2 (two) times daily as needed for muscle spasms.   HYDROCODONE-ACETAMINOPHEN (NORCO) 5-325 MG TABLET    Take 2 tablets by mouth every 4 (four) hours as needed.   NAPROXEN (NAPROSYN) 500 MG TABLET    Take 1 tablet (500 mg total) by mouth 2 (two) times daily.   I personally performed the services described in this documentation, which was scribed in my presence. The recorded information has been reviewed and is accurate.       Arthor Captainbigail Connor Meacham, PA-C 03/11/16 1354    Lyndal Pulleyaniel Knott, MD 03/11/16 [phone]

## 2016-03-11 NOTE — ED Triage Notes (Signed)
Patient complains of lower back pain with movement and change in position x 2 days. Denies any known injury. Fell last night getting on pajamas.

## 2016-03-11 NOTE — ED Notes (Signed)
Declined W/C at D/C and was escorted to lobby by RN. 

## 2017-05-31 ENCOUNTER — Other Ambulatory Visit: Payer: Self-pay | Admitting: Obstetrics & Gynecology

## 2017-05-31 DIAGNOSIS — Z1231 Encounter for screening mammogram for malignant neoplasm of breast: Secondary | ICD-10-CM

## 2017-06-17 ENCOUNTER — Ambulatory Visit: Payer: BLUE CROSS/BLUE SHIELD

## 2017-10-06 ENCOUNTER — Ambulatory Visit: Payer: BLUE CROSS/BLUE SHIELD

## 2017-10-14 ENCOUNTER — Ambulatory Visit: Payer: BLUE CROSS/BLUE SHIELD

## 2018-06-24 ENCOUNTER — Other Ambulatory Visit: Payer: Self-pay | Admitting: Obstetrics and Gynecology

## 2018-06-24 DIAGNOSIS — Z1231 Encounter for screening mammogram for malignant neoplasm of breast: Secondary | ICD-10-CM

## 2018-06-30 ENCOUNTER — Ambulatory Visit: Payer: BLUE CROSS/BLUE SHIELD

## 2018-07-27 ENCOUNTER — Ambulatory Visit: Payer: BLUE CROSS/BLUE SHIELD

## 2018-09-13 ENCOUNTER — Ambulatory Visit: Payer: BLUE CROSS/BLUE SHIELD

## 2018-11-28 ENCOUNTER — Ambulatory Visit (INDEPENDENT_AMBULATORY_CARE_PROVIDER_SITE_OTHER): Payer: 59 | Admitting: Family Medicine

## 2018-11-28 ENCOUNTER — Encounter: Payer: Self-pay | Admitting: Family Medicine

## 2018-11-28 ENCOUNTER — Other Ambulatory Visit: Payer: Self-pay

## 2018-11-28 DIAGNOSIS — M7712 Lateral epicondylitis, left elbow: Secondary | ICD-10-CM | POA: Diagnosis not present

## 2018-11-28 MED ORDER — NABUMETONE 750 MG PO TABS: 750.0000 mg | ORAL_TABLET | Freq: Two times a day (BID) | ORAL | 6 refills | Status: DC | PRN

## 2018-11-28 MED ORDER — NITROGLYCERIN 0.1 MG/HR TD PT24: MEDICATED_PATCH | TRANSDERMAL | 3 refills | Status: DC

## 2018-11-28 NOTE — Progress Notes (Signed)
Dawn Reilly - 53 y.o. female MRN [id number]  Date of birth: [birth year]  Office Visit Note: Visit Date: 11/28/2018 PCP: Servando Salina, MD Referred by: Servando Salina, MD  Subjective: Chief Complaint  Patient presents with  . Left Elbow - Pain    Left elbow pain x 1 month. NKI. Pain medial aspect. Hurts to lift anything with any weight to it. Even typing hurts. Left-hand dominant.   HPI: Dawn Reilly is a 53 y.o. left-handed female who comes in today with left lateral elbow pain for the past month. Reports that pain occurs when she lifts anything. She works as an Environmental consultant at [employer] and typing is bother her as well. No numbness or tingling. She is renovating her daughter's bedroom but pain began prior to renovations so she does not think that's associated. She has tried voltaren gel with some relief. Was prescribed a steroid dose pack by her PCP several weeks ago which helped some. Is currently on her second steroid pack and is not noticing a difference. Heat helps more than ice. Is attempting to rest but its difficult as she is left handed.   ROS Otherwise per HPI.  Assessment & Plan: Visit Diagnoses:  1. Lateral epicondylitis, left elbow     Plan:  - provided lateral epicondylitis stretches and strengthening exercises - will provide elbow strap - nitroglycerin patch 1/4 daily - nabumetone BID PRN - return if pain does not improve, can try steroid injection   Meds & Orders:  Meds ordered this encounter  Medications  . nitroGLYCERIN (NITRO-DUR) 0.1 mg/hr patch    Sig: Apply 1/4 patch to affected area 12 hours daily    Dispense:  30 patch    Refill:  3  . nabumetone (RELAFEN) 750 MG tablet    Sig: Take 1 tablet (750 mg total) by mouth 2 (two) times daily as needed.    Dispense:  60 tablet    Refill:  6   No orders of the defined types were placed in this encounter.   Follow-up: No follow-ups on file.   Procedures: No procedures performed  No  notes on file   Clinical History: No specialty comments available.   She reports that she has never smoked. She does not have any smokeless tobacco history on file. No results for input(s): HGBA1C, LABURIC in the last 8760 hours.  Objective:  VS:  HT:    WT:   BMI:     BP:   HR: bpm  TEMP: ( )  RESP:  Physical Exam  PHYSICAL EXAM: Gen: NAD, alert, cooperative with exam, well-appearing HEENT: clear conjunctiva,  CV:  no edema, capillary refill brisk, normal rate Resp: non-labored Skin: no rashes, normal turgor  Neuro: no gross deficits.  Psych:  alert and oriented  Ortho Exam   Left Elbow: - Inspection: no obvious deformity. No swelling, erythema or bruising - Palpation: TTP over lateral epicondyle  - ROM: full active ROM in flexion and extension. No crepitus. Pain with passive wrist flexion with elbow at full extension.  - Strength: 5/5 strength in wrist flexion. 5/5 wrist extension with pain. 5/5 strength in biceps, triceps. - Neuro: NV intact distally - Special testing: Mild pain over lateral epicondyle with resisted middle finger extension. No pain with resisted supination. Negative tinel's at radial tunnel  Imaging: No results found.  Past Medical/Family/Surgical/Social History: Medications & Allergies reviewed per EMR, new medications updated. There are no active problems to display for this patient.  History  reviewed. No pertinent past medical history. Family History  Problem Relation Age of Onset  . Cancer Mother        pancreas  . Diabetes Mother   . Hypertension Mother   . Hernia Mother    Past Surgical History:  Procedure Laterality Date  . APPENDECTOMY    . CESAREAN SECTION     1987, 1992, 1995, 2005  . TUBAL LIGATION  2005   Social History   Occupational History  . Not on file  Tobacco Use  . Smoking status: Never Smoker  Substance and Sexual Activity  . Alcohol use: Yes    Comment: occasional  . Drug use: No  . Sexual activity: Yes     Birth control/protection: Surgical

## 2018-11-28 NOTE — Progress Notes (Signed)
I saw and examined the patient with Dr. Mayer Masker and agree with assessment and plan as outlined.  Exam consistent with lateral epicondylitis.  Discussed options and will try Relafen, tennis elbow strap, nitroglycerin patches, home exercises.  If persists, could try OT or injection.

## 2018-12-09 ENCOUNTER — Other Ambulatory Visit: Payer: Self-pay | Admitting: *Deleted

## 2018-12-09 DIAGNOSIS — Z20822 Contact with and (suspected) exposure to covid-19: Secondary | ICD-10-CM

## 2018-12-11 LAB — NOVEL CORONAVIRUS, NAA: SARS-CoV-2, NAA: NOT DETECTED

## 2018-12-19 ENCOUNTER — Other Ambulatory Visit: Payer: Self-pay

## 2018-12-19 DIAGNOSIS — Z20822 Contact with and (suspected) exposure to covid-19: Secondary | ICD-10-CM

## 2018-12-20 ENCOUNTER — Telehealth: Payer: Self-pay | Admitting: General Practice

## 2018-12-20 LAB — NOVEL CORONAVIRUS, NAA: SARS-CoV-2, NAA: NOT DETECTED

## 2018-12-20 NOTE — Telephone Encounter (Signed)
Negative COVID results given. Patient results "NOT Detected." Caller expressed understanding. Created mychart for access

## 2019-02-07 ENCOUNTER — Other Ambulatory Visit: Payer: Self-pay

## 2019-02-07 DIAGNOSIS — Z20822 Contact with and (suspected) exposure to covid-19: Secondary | ICD-10-CM

## 2019-02-09 LAB — NOVEL CORONAVIRUS, NAA: SARS-CoV-2, NAA: NOT DETECTED

## 2019-02-14 ENCOUNTER — Other Ambulatory Visit: Payer: Self-pay

## 2019-02-14 DIAGNOSIS — Z20822 Contact with and (suspected) exposure to covid-19: Secondary | ICD-10-CM

## 2019-02-15 LAB — NOVEL CORONAVIRUS, NAA: SARS-CoV-2, NAA: NOT DETECTED

## 2019-07-10 ENCOUNTER — Ambulatory Visit: Payer: 59 | Attending: Internal Medicine

## 2019-07-10 DIAGNOSIS — Z23 Encounter for immunization: Secondary | ICD-10-CM

## 2019-07-10 NOTE — Progress Notes (Signed)
   Covid-19 Vaccination Clinic  Name:  MAYRENE BASTARACHE    MRN: [id number] DOB: [birth year]  07/10/2019  Ms. Ealey was observed post Covid-19 immunization for 15 minutes without incident. She was provided with Vaccine Information Sheet and instruction to access the V-Safe system.   Ms. Bittman was instructed to call 911 with any severe reactions post vaccine: Marland Kitchen Difficulty breathing  . Swelling of face and throat  . A fast heartbeat  . A bad rash all over body  . Dizziness and weakness   Immunizations Administered    Name Date Dose VIS Date Route   Pfizer COVID-19 Vaccine 07/10/2019 12:54 PM 0.3 mL 04/07/2019 Intramuscular   Manufacturer: [employer], [employer]   Lot: [id number]   NDC: [address]-9

## 2019-08-02 ENCOUNTER — Ambulatory Visit: Payer: 59 | Attending: Internal Medicine

## 2019-08-02 DIAGNOSIS — Z23 Encounter for immunization: Secondary | ICD-10-CM

## 2019-08-02 NOTE — Progress Notes (Signed)
   Covid-19 Vaccination Clinic  Name:  Dawn Reilly    MRN: [id number] DOB: January 03, 1966  08/02/2019  [name] was observed post Covid-19 immunization for 15 minutes without incident. She was provided with Vaccine Information Sheet and instruction to access the V-Safe system.   [name] was instructed to call 911 with any severe reactions post vaccine: Marland Kitchen Difficulty breathing  . Swelling of face and throat  . A fast heartbeat  . A bad rash all over body  . Dizziness and weakness   Immunizations Administered    Name Date Dose VIS Date Route   Pfizer COVID-19 Vaccine 08/02/2019  1:34 PM 0.3 mL 04/07/2019 Intramuscular   Manufacturer: [employer], [employer]   Lot: [id number]   NDC: [address]-0

## 2020-06-04 ENCOUNTER — Other Ambulatory Visit (HOSPITAL_COMMUNITY): Payer: Self-pay | Admitting: Internal Medicine

## 2020-06-04 ENCOUNTER — Other Ambulatory Visit: Payer: Self-pay

## 2020-06-04 ENCOUNTER — Ambulatory Visit (HOSPITAL_COMMUNITY)
Admission: RE | Admit: 2020-06-04 | Discharge: 2020-06-04 | Disposition: A | Discharge status: Home / Self Care | Payer: Medicaid Other | Source: Ambulatory Visit | Attending: Internal Medicine | Admitting: Internal Medicine

## 2020-06-04 DIAGNOSIS — M79605 Pain in left leg: Secondary | ICD-10-CM

## 2020-06-04 DIAGNOSIS — M7989 Other specified soft tissue disorders: Secondary | ICD-10-CM | POA: Insufficient documentation

## 2020-06-04 NOTE — Progress Notes (Signed)
Lower extremity venous has been completed.   Preliminary results in CV Proc.   Blanch Media 06/04/2020 3:14 PM

## 2020-07-19 ENCOUNTER — Ambulatory Visit: Payer: 59 | Admitting: Physician Assistant

## 2020-09-10 ENCOUNTER — Other Ambulatory Visit: Payer: Self-pay | Admitting: Neurology

## 2020-09-10 ENCOUNTER — Encounter: Payer: Self-pay | Admitting: Neurology

## 2020-09-11 ENCOUNTER — Encounter: Payer: Self-pay | Admitting: Neurology

## 2020-09-11 ENCOUNTER — Ambulatory Visit: Payer: Medicaid Other | Admitting: Neurology

## 2020-09-11 ENCOUNTER — Other Ambulatory Visit: Payer: Self-pay

## 2020-09-11 VITALS — BP 128/82 | HR 77 | Ht 62.0 in | Wt 273.3 lb

## 2020-09-11 DIAGNOSIS — R351 Nocturia: Secondary | ICD-10-CM | POA: Diagnosis not present

## 2020-09-11 DIAGNOSIS — R519 Headache, unspecified: Secondary | ICD-10-CM

## 2020-09-11 DIAGNOSIS — R0683 Snoring: Secondary | ICD-10-CM | POA: Diagnosis not present

## 2020-09-11 DIAGNOSIS — G4719 Other hypersomnia: Secondary | ICD-10-CM | POA: Diagnosis not present

## 2020-09-11 DIAGNOSIS — Z820 Family history of epilepsy and other diseases of the nervous system: Secondary | ICD-10-CM

## 2020-09-11 DIAGNOSIS — Z6841 Body Mass Index (BMI) 40.0 and over, adult: Secondary | ICD-10-CM

## 2020-09-11 NOTE — Patient Instructions (Signed)

## 2020-09-11 NOTE — Progress Notes (Signed)
Subjective:    Patient ID: Dawn Reilly is a 55 y.o. female.  HPI     Huston Foley, MD, PhD [facility] [address] [address] [place], [place] [address]  Dear Dawn Reilly,   I saw your patient, Dawn Reilly, upon your kind request in my Sleep clinic today for initial consultation of her sleep disorder, in particular, concern for underlying obstructive sleep apnea.  The patient is unaccompanied today.  As you know, [name] is a 55 year old right-handed woman with an underlying medical history of hypertension, and severe obesity with a BMI of over 45, who reports snoring and excessive daytime somnolence, sleep disruption and recent problem with nocturia.  The patient reports that her blood pressure was elevated recently.  She had an increase in her losartan which helps.  She has a family history of sleep apnea, her mom had sleep apnea.  Patient is divorced, she has 4 children, 3 are grown and out of her home, her youngest daughter is 7 and lives with her.  They have no pets in the house.  Patient has a TV in the bedroom and watches it before falling asleep.  She is generally in bed by 10 PM and asleep often by 11:15 PM.  She does take Ambien at night as needed, 5 mg strength generic.  She has been on generic Ambien off and on for the past 2 years.  She has a history of snoring but is not sure how bad it is, has woken up occasionally with a dull, achy headache.  She has in the past few weeks experience nocturia about 2-3 times per night.  Rise time is around 630.  She works as a Engineer, structural for 2 clients.  She drinks caffeine in the form of coffee or tea or soda, generally 2 servings per day.  She drinks alcohol maybe twice a week, is a non-smoker.  Weight has increased over time.  She has contemplated bariatric surgery. Her Epworth sleepiness score is 11 out of 24, fatigue severity score is 49 out of 63.   Her Past Medical History Is Significant For: Past Medical  History:  Diagnosis Date  . Hypertension     Her Past Surgical History Is Significant For: Past Surgical History:  Procedure Laterality Date  . APPENDECTOMY    . CESAREAN SECTION     1987, 1992, 1995, 2005  . TUBAL LIGATION  2005    Her Family History Is Significant For: Family History  Problem Relation Age of Onset  . Hypertension Father   . Stroke Father   . Cancer Mother        pancreas  . Diabetes Mother   . Hypertension Mother   . Hernia Mother     Her Social History Is Significant For: Social History   Socioeconomic History  . Marital status: Married    Spouse name: Not on file  . Number of children: Not on file  . Years of education: Not on file  . Highest education level: Not on file  Occupational History  . Not on file  Tobacco Use  . Smoking status: Never Smoker  . Smokeless tobacco: Never Used  Substance and Sexual Activity  . Alcohol use: Yes    Comment: occasional  . Drug use: No  . Sexual activity: Yes    Birth control/protection: Surgical  Other Topics Concern  . Not on file  Social History Narrative  . Not on file   Social Determinants of  Health   Financial Resource Strain: Not on file  Food Insecurity: Not on file  Transportation Needs: Not on file  Physical Activity: Not on file  Stress: Not on file  Social Connections: Not on file    Her Allergies Are:  Allergies  Allergen Reactions  . Eicosapentaenoic Acid (Epa) Anaphylaxis    All seafoods  . Shellfish Allergy Swelling  :   Her Current Medications Are:  Outpatient Encounter Medications as of 09/11/2020  Medication Sig  . Acetaminophen (TYLENOL 8 HOUR PO) Take by mouth.  . losartan (COZAAR) 50 MG tablet SMARTSIG:1 Tablet(s) By Mouth Every Evening  . Multiple Vitamin (MULTIVITAMIN) capsule Take 1 capsule by mouth daily.  Marland Kitchen zolpidem (AMBIEN) 5 MG tablet TK ONE T PO QHS PRF INSOMNIA  . [DISCONTINUED] nabumetone (RELAFEN) 750 MG tablet Take 1 tablet (750 mg total) by mouth 2  (two) times daily as needed.  . [DISCONTINUED] nitroGLYCERIN (NITRO-DUR) 0.1 mg/hr patch Apply 1/4 patch to affected area 12 hours daily  . [DISCONTINUED] predniSONE (DELTASONE) 20 MG tablet   . [DISCONTINUED] predniSONE (STERAPRED UNI-PAK 21 TAB) 10 MG (21) TBPK tablet TAPER UTD OVER 6 DAYS   No facility-administered encounter medications on file as of 09/11/2020.  :   Review of Systems:  Out of a complete 14 point review of systems, all are reviewed and negative with the exception of these symptoms as listed below: Review of Systems  Neurological:       Here for sleep consult. No prior sleep study. Pt reports she has a difficult sleep at night, up and down throughout the night. She also reports increased frequency of using of the bathroom at night. Pt reports she occasionally snores at night. .  Epworth Sleepiness Scale 0= would never doze 1= slight chance of dozing 2= moderate chance of dozing 3= high chance of dozing  Sitting and reading:1 Watching TV:3 Sitting inactive in a public place (ex. Theater or meeting):1 As a passenger in a car for an hour without a break:1 Lying down to rest in the afternoon:0 Sitting and talking to someone:3 Sitting quietly after lunch (no alcohol):2 In a car, while stopped in traffic:0 Total:11      Objective:  Neurological Exam  Physical Exam Physical Examination:   Vitals:   09/11/20 1329  BP: 128/82  Pulse: 77  SpO2: 99%    General Examination: The patient is a very pleasant 55 y.o. female in no acute distress. She appears well-developed and well-nourished and well groomed.   HEENT: Normocephalic, atraumatic, pupils are equal, round and reactive to light, extraocular tracking is good without limitation to gaze excursion or nystagmus noted. Hearing is grossly intact. Face is symmetric with normal facial animation. Speech is clear with no dysarthria noted. There is no hypophonia. There is no lip, neck/head, jaw or voice tremor. Neck  is supple with full range of passive and active motion. There are no carotid bruits on auscultation. Oropharynx exam reveals: moderate mouth dryness, good dental hygiene and moderate airway crowding, due to prominent uvula, tonsillar size of 1-2+ bilaterally.  Mallampati class III.  Tongue protrudes centrally and palate elevates symmetrically.  Neck circumference is 13 three-quarter inches.  She does not have much in the way of overbite.    Chest: Clear to auscultation without wheezing, rhonchi or crackles noted.  Heart: S1+S2+0, regular and normal without murmurs, rubs or gallops noted.   Abdomen: Soft, non-tender and non-distended with normal bowel sounds appreciated on auscultation.  Extremities: There is no pitting  edema in the distal lower extremities bilaterally.  Pedal pulses are intact.  Skin: Warm and dry without trophic changes noted.   Musculoskeletal: exam reveals no obvious joint deformities, tenderness.   Neurologically:  Mental status: The patient is awake, alert and oriented in all 4 spheres. Her immediate and remote memory, attention, language skills and fund of knowledge are appropriate. There is no evidence of aphasia, agnosia, apraxia or anomia. Speech is clear with normal prosody and enunciation. Thought process is linear. Mood is normal and affect is normal.  Cranial nerves II - XII are as described above under HEENT exam.  Motor exam: Normal bulk, strength and tone is noted. There is no tremor, Romberg is negative. Fine motor skills and coordination: grossly intact.  Cerebellar testing: No dysmetria or intention tremor. There is no truncal or gait ataxia.  Sensory exam: intact to light touch in the upper and lower extremities.  Gait, station and balance: She stands easily. No veering to one side is noted. No leaning to one side is noted. Posture is age-appropriate and stance is narrow based. Gait shows normal stride length and normal pace. No problems turning are noted.  Tandem walk is unremarkable.               Assessment and Plan:   In summary, Chakara Bognar Sherrow is a very pleasant 55 y.o.-year old female with an underlying medical history of hypertension, and severe obesity with a BMI of over 45, whose history and physical exam are concerning for obstructive sleep apnea (OSA). I had a long chat with the patient about my findings and the diagnosis of OSA, its prognosis and treatment options. We talked about medical treatments, surgical interventions and non-pharmacological approaches. I explained in particular the risks and ramifications of untreated moderate to severe OSA, especially with respect to developing cardiovascular disease down the Road, including congestive heart failure, difficult to treat hypertension, cardiac arrhythmias, or stroke. Even type 2 diabetes has, in part, been linked to untreated OSA. Symptoms of untreated OSA include daytime sleepiness, memory problems, mood irritability and mood disorder such as depression and anxiety, lack of energy, as well as recurrent headaches, especially morning headaches. We talked about trying to maintain a healthy lifestyle in general, as well as the importance of weight control. We also talked about the importance of good sleep hygiene. I recommended the following at this time: sleep study.   I explained the sleep test procedure to the patient and also outlined possible surgical and non-surgical treatment options of OSA, including the use of a custom-made dental device (which would require a referral to a specialist dentist or oral surgeon), upper airway surgical options, such as traditional UPPP or a novel less invasive surgical option in the form of Inspire hypoglossal nerve stimulation (which would involve a referral to an ENT surgeon). I also explained the CPAP treatment option to the patient, who indicated that she would be willing to try CPAP if the need arises. I explained the importance of being compliant with  PAP treatment, not only for insurance purposes but primarily to improve Her symptoms, and for the patient's long term health benefit, including to reduce Her cardiovascular risks. I answered all her questions today and the patient was in agreement. I plan to see her back after the sleep study is completed and encouraged her to call with any interim questions, concerns, problems or updates.   Thank you very much for allowing me to participate in the care of this nice patient.  If I can be of any further assistance to you please do not hesitate to call me at [phone].  Sincerely,   Star Age, MD, PhD

## 2020-10-23 ENCOUNTER — Ambulatory Visit (INDEPENDENT_AMBULATORY_CARE_PROVIDER_SITE_OTHER): Payer: Medicaid Other | Admitting: Neurology

## 2020-10-23 DIAGNOSIS — R351 Nocturia: Secondary | ICD-10-CM

## 2020-10-23 DIAGNOSIS — G4710 Hypersomnia, unspecified: Secondary | ICD-10-CM

## 2020-10-23 DIAGNOSIS — Z820 Family history of epilepsy and other diseases of the nervous system: Secondary | ICD-10-CM

## 2020-10-23 DIAGNOSIS — G4733 Obstructive sleep apnea (adult) (pediatric): Secondary | ICD-10-CM

## 2020-10-23 DIAGNOSIS — G4719 Other hypersomnia: Secondary | ICD-10-CM

## 2020-10-23 DIAGNOSIS — R0683 Snoring: Secondary | ICD-10-CM

## 2020-10-23 DIAGNOSIS — R519 Headache, unspecified: Secondary | ICD-10-CM

## 2020-10-23 DIAGNOSIS — Z6841 Body Mass Index (BMI) 40.0 and over, adult: Secondary | ICD-10-CM

## 2020-10-24 NOTE — Progress Notes (Signed)
Patient referred by Willis Modena, NP, seen by me on 09/11/20, patient had a HST on 10/23/20.    Please call and notify the patient that the recent home sleep test showed obstructive sleep apnea in the moderate range. I recommend treatment in the form of autoPAP, which means, that we don't have to bring her in for a sleep study with CPAP, but will let her start using a so called autoPAP machine at home, which is a CPAP-like machine with self-adjusting pressures. We will send the order to a local DME company (of he choice, or as per insurance requirement). The DME representative will fit her with a mask, educate her on how to use the machine, how to put the mask on, etc. I have placed an order in the chart. Please send the order, talk to patient, send report to referring MD. We will need a FU in sleep clinic for 10 weeks post-PAP set up, please arrange that with me or one of our NPs. Also reinforce the need for compliance with treatment. Thanks,   Huston Foley, MD, PhD [facility])

## 2020-10-24 NOTE — Progress Notes (Signed)
See procedure note.

## 2020-10-24 NOTE — Addendum Note (Signed)
Addended by: Huston Foley on: 10/24/2020 05:27 PM   Modules accepted: Orders

## 2020-10-24 NOTE — Procedures (Signed)
   [facility]  HOME SLEEP TEST (Watch PAT) REPORT  STUDY DATE: 10-23-20  DOB: Jul 28, 1965  MRN: [id number]  ORDERING CLINICIAN: Huston Foley, MD, PhD   REFERRING CLINICIAN: Willis Modena, NP   CLINICAL INFORMATION/HISTORY: 55 year old woman with a history of hypertension, and severe obesity with a BMI of over 45, who reports snoring and excessive daytime somnolence, sleep disruption and recent problem with nocturia.   Epworth sleepiness score: 11/24.  BMI: 50.3 kg/m  Neck Circumference: 13.75"  FINDINGS:   Sleep Summary:   Total Recording Time (hours, min): 7 h 22 min  Total Sleep Time (hours, min):  6 h 2 min   Percent REM (%):    33.12%   Respiratory Indices:   Calculated pAHI (per hour):  25.4/hour         REM pAHI:      37.5/hour       NREM pAHI:   20.4/hour  Supine AHI:   21.9/hour  Oxygen Saturation Statistics:    Oxygen Saturation (%) Mean: 93%   Minimum oxygen saturation (%):       83%   O2 Saturation Range (%):  83 - 98%    O2 Saturation (minutes) <=88%: 2.6 min  Pulse Rate Statistics:   Pulse Mean (bpm):    83/min    Pulse Range    (53 - 112/min)   IMPRESSION: OSA (obstructive sleep apnea)   RECOMMENDATION:  This home sleep test demonstrates moderate obstructive sleep apnea with a total AHI of 25.4/hour and O2 nadir of 83%.  Moderate to loud snoring was detected.  Treatment with positive airway pressure is recommended. The patient will be advised to proceed with an autoPAP titration/trial at home for now. A full night titration study may be considered to optimize treatment settings, if needed down the road. Please note that untreated obstructive sleep apnea may carry additional perioperative morbidity. Patients with significant obstructive sleep apnea should receive perioperative PAP therapy and the surgeons and particularly the anesthesiologist should be informed of the diagnosis and the severity of the sleep disordered breathing. The  patient should be cautioned not to drive, work at heights, or operate dangerous or heavy equipment when tired or sleepy. Review and reiteration of good sleep hygiene measures should be pursued with any patient. Other causes of the patient's symptoms, including circadian rhythm disturbances, an underlying mood disorder, medication effect and/or an underlying medical problem cannot be ruled out based on this test. Clinical correlation is recommended. The patient and her referring provider will be notified of the test results. The patient will be seen in follow up in sleep clinic at [facility].  I certify that I have reviewed the raw data recording prior to the issuance of this report in accordance with the standards of the American Academy of Sleep Medicine (AASM).  INTERPRETING PHYSICIAN:   Huston Foley, MD, PhD  Board Certified in Neurology and Sleep Medicine  [facility] [address], [address] [place], [place] [address] [phone]

## 2020-10-30 ENCOUNTER — Telehealth: Payer: Self-pay

## 2020-10-30 NOTE — Telephone Encounter (Signed)
-----   Message from Huston Foley, MD sent at 10/24/2020  5:27 PM EDT ----- Patient referred by Willis Modena, NP, seen by me on 09/11/20, patient had a HST on 10/23/20.    Please call and notify the patient that the recent home sleep test showed obstructive sleep apnea in the moderate range. I recommend treatment in the form of autoPAP, which means, that we don't have to bring her in for a sleep study with CPAP, but will let her start using a so called autoPAP machine at home, which is a CPAP-like machine with self-adjusting pressures. We will send the order to a local DME company (of he choice, or as per insurance requirement). The DME representative will fit her with a mask, educate her on how to use the machine, how to put the mask on, etc. I have placed an order in the chart. Please send the order, talk to patient, send report to referring MD. We will need a FU in sleep clinic for 10 weeks post-PAP set up, please arrange that with me or one of our NPs. Also reinforce the need for compliance with treatment. Thanks,   Huston Foley, MD, PhD [facility])

## 2020-10-30 NOTE — Telephone Encounter (Signed)
I called pt. I advised pt that Dr. Frances Furbish reviewed their sleep study results and found that pt moderate osa. Dr. Frances Furbish recommends that pt start on auto-pap for treatment. I reviewed PAP compliance expectations with the pt. Pt is agreeable to starting an auto-PAP. I advised pt that an order will be sent to a DME, [facility], and [facility] will call the pt within about one week after they file with the pt's insurance. [facility] will show the pt how to use the machine, fit for masks, and troubleshoot the auto-PAP if needed. Pt will cb once she is started on her autopap so we can schedule a 31-90 day f/u. Pt understands it is a insurance compliance guideline for our office to see her back within this time frame. A letter with all of this information in it will be mailed to the pt as a reminder. I verified with the pt that the address we have on file is correct. Pt verbalized understanding of results. Pt had no questions at this time but was encouraged to call back if questions arise. I have sent the order to [facility] and have received confirmation that they have received the order.

## 2020-11-27 ENCOUNTER — Telehealth: Payer: Self-pay

## 2020-11-27 NOTE — Telephone Encounter (Signed)
Received message from [employer] stating  I called patient to get scheduled. She decided she no longer wants the machine she states she doesn't want to sleep with anything on her face,  she was close to the percentage of not having to use a CPAP machine.  She would rather not use it.   FYI

## 2021-06-24 ENCOUNTER — Other Ambulatory Visit: Payer: Self-pay | Admitting: Internal Medicine

## 2021-06-24 DIAGNOSIS — Z1231 Encounter for screening mammogram for malignant neoplasm of breast: Secondary | ICD-10-CM

## 2021-08-01 ENCOUNTER — Ambulatory Visit
Admission: RE | Admit: 2021-08-01 | Discharge: 2021-08-01 | Disposition: A | Discharge status: Home / Self Care | Payer: Medicaid Other | Source: Ambulatory Visit | Attending: Internal Medicine | Admitting: Internal Medicine

## 2021-08-01 DIAGNOSIS — Z1231 Encounter for screening mammogram for malignant neoplasm of breast: Secondary | ICD-10-CM

## 2022-05-12 ENCOUNTER — Encounter (INDEPENDENT_AMBULATORY_CARE_PROVIDER_SITE_OTHER): Payer: Self-pay

## 2022-11-11 ENCOUNTER — Telehealth: Payer: Self-pay

## 2022-11-11 NOTE — Telephone Encounter (Signed)
Left message on voicemail  at [phone]

## 2024-03-09 ENCOUNTER — Other Ambulatory Visit: Payer: Self-pay | Admitting: Plastic Surgery

## 2024-03-09 DIAGNOSIS — Z1231 Encounter for screening mammogram for malignant neoplasm of breast: Secondary | ICD-10-CM

## 2024-03-09 NOTE — Progress Notes (Addendum)
 Subjective Patient ID: Dawn Reilly is a 58 y.o. female.   HPI  Presents for consultation breast reduction. Current 44G. Reports several year history neck back shoulder pain. She has tried specialty fitted bras, OTC pain medication, weight loss as below, muscle relaxants for over 3 month trial without effect. Denies rashes. Reports weight of breast has affected her posture due to weight.   Wt- started Ozempic followed by [facility] with 88 lb wt loss over 9 mo. She has been off Wegovy for 3 mo as insurance no longer covers for 3 months and reports weight stable.  MMG 2023. Mother with pancreatic ca. Denies FH breast or ovarian ca.  PMH includes OSA.  Lives alone, widowed. 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 Daughters in [place] area would assist with post op care.  Review of Systems  Musculoskeletal:  Positive for back pain and neck pain.  All other systems reviewed and are negative.   Objective Physical Exam  Cardiovascular: Normal rate.   Pulmonary/Chest Effort normal.   Skin  Fitzpatrick 6   Psychiatric She has a normal mood and affect.    Lymph: no palpable axillary adenopathy  +shoulder grooving Breasts: no palpable masses, grade 3 ptosis bilateral SN to nipple R 39.5 L 38 cm BW R 21 L 21 cm Nipple to IMF R 18 L 17 cm  Assessment/Plan Diagnoses and all orders for this visit:  Macromastia  Chronic neck and back pain  Overdue for MMG. Patient to schedule.  Chronic neck and back pain shoulder pain in setting of macromastia that has failed conservative management over 3 month trial. The pain is not related to other diagnoses.There is a reasonable likelihood that the patient's symptoms are primarily due to macromastia. Breast reduction is likely to result in improvement of the chronic pain.   Reviewed reduction with anchor type scars, OP surgery, drains, post operative visits and limitations, recovery. Diminished sensation nipple and  breast skin, risk of nipple loss, wound healing problems, asymmetry, incidental carcinoma, changes with wt gain/loss, aging, unacceptable cosmetic appearance reviewed. Reviewed scar maturation over months, risks hyper or hypopigmented scar. Reviewed cannot assure cup size. Reviewed that this is not a permanent operation.    Pictures today.   Anticipate 713 g resection from each breast  ADDENDUM 12.15.2025 MMG 12.9.2025 BIRADS 1

## 2024-04-04 ENCOUNTER — Ambulatory Visit
Admission: RE | Admit: 2024-04-04 | Discharge: 2024-04-04 | Disposition: A | Source: Ambulatory Visit | Attending: Plastic Surgery

## 2024-04-04 DIAGNOSIS — Z1231 Encounter for screening mammogram for malignant neoplasm of breast: Secondary | ICD-10-CM

## 2024-05-08 NOTE — H&P (Signed)
 Subjective Patient ID: Dawn Reilly is a 59 y.o. female.     HPI   Returns for follow up discussion prior to planned breast reduction. Current 44G. Reports several year history neck back shoulder pain. She has tried specialty fitted bras, OTC pain medication, weight loss as below, muscle relaxants for over 3 month trial without effect. Denies rashes. Reports weight of breast has affected her posture due to weight.    Wt- started Ozempic followed by [facility] with 88 lb wt loss over 9 mo. Since time of consult she has resumed this, weight stable since time of consult.    MMG 03/2024 benign. Mother with pancreatic ca. Denies FH breast or ovarian ca.   PMH includes OSA.   Lives alone, widowed. 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 Daughters in [place] area would assist with post op care.   Review of Systems  Musculoskeletal:  Positive for back pain and neck pain.  All other systems reviewed and are negative.    Objective Physical Exam  Cardiovascular: Normal rate, regular rhythm and normal heart sounds.    Pulmonary/Chest Effort normal and breath sounds normal.    Skin   Fitzpatrick 6     Lymph: no palpable axillary adenopathy   +shoulder grooving Breasts: no palpable masses, grade 3 ptosis bilateral SN to nipple R 39.5 L 38 cm BW R 21 L 21 cm Nipple to IMF R 18 L 17 cm   Assessment/Plan Diagnoses and all orders for this visit:   Macromastia   Chronic neck and back pain   Hold glutide week prior to surgery.   Chronic neck and back pain shoulder pain in setting of macromastia that has failed conservative management over 3 month trial. The pain is not related to other diagnoses.There is a reasonable likelihood that the patient's symptoms are primarily due to macromastia. Breast reduction is likely to result in improvement of the chronic pain.   Reviewed reduction with anchor type scars, drains, post operative visits and limitations, recovery.  Diminished sensation nipple and breast skin, risk of nipple loss, wound healing problems, asymmetry, incidental carcinoma, changes with wt gain/loss, aging, unacceptable cosmetic appearance reviewed. Reviewed scar maturation over months, risks hyper or hypopigmented scar. Reviewed cannot assure cup size. Reviewed that this is not a permanent operation. Reviewed possibility of conversion to free nipple grafts intraoperatively if any concern for NAC viability. Reviewed in this setting she would have no sensation and no stimulation nipple permanently.    Additional risks including but not limited to bleeding, hematoma, seroma, infection, wound healing problems, need for additional surgery, damage to adjacent structures, blood clots in legs or lung reviewed. Completed ASPS consent for breast reduction.  Drain teaching completed. Rx for oxycodone  given. Patient desires overnight stay.   Anticipate 713 g resection from each breast   Earlis Ranks, MD [facility]  Office/ physician access line after hours [phone]

## 2024-05-09 ENCOUNTER — Other Ambulatory Visit: Payer: Self-pay

## 2024-05-09 ENCOUNTER — Encounter (HOSPITAL_BASED_OUTPATIENT_CLINIC_OR_DEPARTMENT_OTHER): Payer: Self-pay | Admitting: Plastic Surgery

## 2024-05-14 NOTE — Anesthesia Preprocedure Evaluation (Signed)
"                                    Anesthesia Evaluation  Patient identified by MRN, date of birth, ID band Patient awake    Reviewed: Allergy & Precautions, NPO status , Patient's Chart, lab work & pertinent test results  Airway Mallampati: II  TM Distance: >3 FB Neck ROM: Full    Dental  (+) Teeth Intact, Dental Advisory Given, Chipped,    Pulmonary neg pulmonary ROS   Pulmonary exam normal breath sounds clear to auscultation       Cardiovascular hypertension, (-) angina (-) Past MI Normal cardiovascular exam Rhythm:Regular Rate:Normal     Neuro/Psych negative neurological ROS  negative psych ROS   GI/Hepatic negative GI ROS, Neg liver ROS,,,  Endo/Other    Class 3 obesity  Renal/GU negative Renal ROS     Musculoskeletal negative musculoskeletal ROS (+)    Abdominal   Peds  Hematology   Anesthesia Other Findings Macromastia, chronic neck and back pain  Reproductive/Obstetrics                              Anesthesia Physical Anesthesia Plan  ASA: 3  Anesthesia Plan: General   Post-op Pain Management: Tylenol  PO (pre-op)*, Gabapentin  PO (pre-op)* and Celebrex  PO (pre-op)*   Induction: Intravenous  PONV Risk Score and Plan: 4 or greater and Treatment may vary due to age or medical condition, Ondansetron , Dexamethasone , Propofol  infusion, TIVA and Midazolam   Airway Management Planned: Oral ETT  Additional Equipment: None  Intra-op Plan:   Post-operative Plan: Extubation in OR  Informed Consent: I have reviewed the patients History and Physical, chart, labs and discussed the procedure including the risks, benefits and alternatives for the proposed anesthesia with the patient or authorized representative who has indicated his/her understanding and acceptance.     Dental advisory given  Plan Discussed with: CRNA  Anesthesia Plan Comments:          Anesthesia Quick Evaluation  "

## 2024-05-16 ENCOUNTER — Other Ambulatory Visit: Payer: Self-pay

## 2024-05-16 ENCOUNTER — Ambulatory Visit (HOSPITAL_BASED_OUTPATIENT_CLINIC_OR_DEPARTMENT_OTHER)
Admission: RE | Admit: 2024-05-16 | Discharge: 2024-05-17 | Disposition: A | Discharge status: Home / Self Care | Attending: Plastic Surgery | Admitting: Plastic Surgery

## 2024-05-16 ENCOUNTER — Encounter (HOSPITAL_BASED_OUTPATIENT_CLINIC_OR_DEPARTMENT_OTHER): Admission: RE | Disposition: A | Payer: Self-pay | Source: Home / Self Care | Attending: Plastic Surgery

## 2024-05-16 ENCOUNTER — Encounter (HOSPITAL_BASED_OUTPATIENT_CLINIC_OR_DEPARTMENT_OTHER): Payer: Self-pay | Admitting: Plastic Surgery

## 2024-05-16 ENCOUNTER — Ambulatory Visit (HOSPITAL_BASED_OUTPATIENT_CLINIC_OR_DEPARTMENT_OTHER): Payer: Self-pay | Admitting: Anesthesiology

## 2024-05-16 ENCOUNTER — Encounter (HOSPITAL_BASED_OUTPATIENT_CLINIC_OR_DEPARTMENT_OTHER): Payer: Self-pay | Admitting: Anesthesiology

## 2024-05-16 DIAGNOSIS — M549 Dorsalgia, unspecified: Secondary | ICD-10-CM | POA: Diagnosis not present

## 2024-05-16 DIAGNOSIS — Z01818 Encounter for other preprocedural examination: Secondary | ICD-10-CM

## 2024-05-16 DIAGNOSIS — G8929 Other chronic pain: Secondary | ICD-10-CM | POA: Insufficient documentation

## 2024-05-16 DIAGNOSIS — N62 Hypertrophy of breast: Secondary | ICD-10-CM

## 2024-05-16 DIAGNOSIS — Z6841 Body Mass Index (BMI) 40.0 and over, adult: Secondary | ICD-10-CM

## 2024-05-16 DIAGNOSIS — I10 Essential (primary) hypertension: Secondary | ICD-10-CM | POA: Diagnosis not present

## 2024-05-16 DIAGNOSIS — E66813 Obesity, class 3: Secondary | ICD-10-CM | POA: Insufficient documentation

## 2024-05-16 DIAGNOSIS — G4733 Obstructive sleep apnea (adult) (pediatric): Secondary | ICD-10-CM | POA: Diagnosis not present

## 2024-05-16 DIAGNOSIS — M542 Cervicalgia: Secondary | ICD-10-CM | POA: Insufficient documentation

## 2024-05-16 DIAGNOSIS — M5489 Other dorsalgia: Secondary | ICD-10-CM | POA: Diagnosis present

## 2024-05-16 HISTORY — PX: BREAST REDUCTION SURGERY: SHX8

## 2024-05-16 MED ORDER — METHOCARBAMOL 500 MG PO TABS
500.0000 mg | ORAL_TABLET | Freq: Four times a day (QID) | ORAL | Status: DC | PRN
  Administered 2024-05-17: 500 mg via ORAL
  Filled 2024-05-16: qty 1

## 2024-05-16 MED ORDER — SUGAMMADEX SODIUM 200 MG/2ML IV SOLN
INTRAVENOUS | Status: DC | PRN
  Administered 2024-05-16: 200 mg via INTRAVENOUS

## 2024-05-16 MED ORDER — GABAPENTIN 300 MG PO CAPS
ORAL_CAPSULE | ORAL | Status: AC
  Filled 2024-05-16: qty 1

## 2024-05-16 MED ORDER — OXYCODONE HCL 5 MG PO TABS: 5.0000 mg | ORAL_TABLET | Freq: Once | ORAL | Status: DC | PRN

## 2024-05-16 MED ORDER — ACETAMINOPHEN 500 MG PO TABS
1000.0000 mg | ORAL_TABLET | ORAL | Status: AC
  Administered 2024-05-16: 1000 mg via ORAL

## 2024-05-16 MED ORDER — CEFAZOLIN SODIUM-DEXTROSE 2-4 GM/100ML-% IV SOLN
INTRAVENOUS | Status: AC
  Filled 2024-05-16: qty 100

## 2024-05-16 MED ORDER — DEXAMETHASONE SODIUM PHOSPHATE 4 MG/ML IJ SOLN
INTRAMUSCULAR | Status: DC | PRN
  Administered 2024-05-16: 5 mg via INTRAVENOUS

## 2024-05-16 MED ORDER — KETOROLAC TROMETHAMINE 30 MG/ML IJ SOLN: 30.0000 mg | Freq: Once | INTRAMUSCULAR | Status: DC | PRN

## 2024-05-16 MED ORDER — SUCCINYLCHOLINE CHLORIDE 200 MG/10ML IV SOSY
PREFILLED_SYRINGE | INTRAVENOUS | Status: AC
  Filled 2024-05-16: qty 10

## 2024-05-16 MED ORDER — ROCURONIUM BROMIDE 10 MG/ML (PF) SYRINGE
PREFILLED_SYRINGE | INTRAVENOUS | Status: AC
  Filled 2024-05-16: qty 10

## 2024-05-16 MED ORDER — MIDAZOLAM HCL 5 MG/5ML IJ SOLN
INTRAMUSCULAR | Status: DC | PRN
  Administered 2024-05-16: 2 mg via INTRAVENOUS

## 2024-05-16 MED ORDER — ATROPINE SULFATE (PF) 0.4 MG/ML IJ SOLN
INTRAMUSCULAR | Status: AC
  Filled 2024-05-16: qty 1

## 2024-05-16 MED ORDER — ONDANSETRON HCL 4 MG/2ML IJ SOLN
INTRAMUSCULAR | Status: AC
  Filled 2024-05-16: qty 2

## 2024-05-16 MED ORDER — SCOPOLAMINE 1 MG/3DAYS TD PT72
MEDICATED_PATCH | TRANSDERMAL | Status: AC
  Filled 2024-05-16: qty 1

## 2024-05-16 MED ORDER — DIPHENHYDRAMINE HCL 50 MG/ML IJ SOLN
INTRAMUSCULAR | Status: DC | PRN
  Administered 2024-05-16: 6.25 mg via INTRAVENOUS

## 2024-05-16 MED ORDER — DEXMEDETOMIDINE HCL IN NACL 80 MCG/20ML IV SOLN
INTRAVENOUS | Status: AC
  Filled 2024-05-16: qty 20

## 2024-05-16 MED ORDER — MIDAZOLAM HCL 2 MG/2ML IJ SOLN
INTRAMUSCULAR | Status: AC
  Filled 2024-05-16: qty 2

## 2024-05-16 MED ORDER — HYDROMORPHONE HCL 1 MG/ML IJ SOLN
INTRAMUSCULAR | Status: AC
  Filled 2024-05-16: qty 0.5

## 2024-05-16 MED ORDER — ACETAMINOPHEN 500 MG PO TABS: 1000.0000 mg | ORAL_TABLET | Freq: Once | ORAL | Status: AC

## 2024-05-16 MED ORDER — LIDOCAINE 2% (20 MG/ML) 5 ML SYRINGE
INTRAMUSCULAR | Status: AC
  Filled 2024-05-16: qty 5

## 2024-05-16 MED ORDER — DEXAMETHASONE SOD PHOSPHATE PF 10 MG/ML IJ SOLN
INTRAMUSCULAR | Status: AC
  Filled 2024-05-16: qty 1

## 2024-05-16 MED ORDER — HYDROMORPHONE HCL 1 MG/ML IJ SOLN
0.2500 mg | INTRAMUSCULAR | Status: DC | PRN
  Administered 2024-05-16 (×2): 0.5 mg via INTRAVENOUS

## 2024-05-16 MED ORDER — HYDROMORPHONE HCL 1 MG/ML IJ SOLN: 0.5000 mg | INTRAMUSCULAR | Status: DC | PRN

## 2024-05-16 MED ORDER — CHLORHEXIDINE GLUCONATE CLOTH 2 % EX PADS: 6.0000 | MEDICATED_PAD | Freq: Once | CUTANEOUS | Status: DC

## 2024-05-16 MED ORDER — ACETAMINOPHEN 500 MG PO TABS
ORAL_TABLET | ORAL | Status: AC
  Filled 2024-05-16: qty 2

## 2024-05-16 MED ORDER — OXYCODONE HCL 5 MG PO TABS
5.0000 mg | ORAL_TABLET | ORAL | Status: DC | PRN
  Administered 2024-05-16: 5 mg via ORAL
  Filled 2024-05-16: qty 1

## 2024-05-16 MED ORDER — GABAPENTIN 300 MG PO CAPS
300.0000 mg | ORAL_CAPSULE | ORAL | Status: AC
  Administered 2024-05-16: 300 mg via ORAL

## 2024-05-16 MED ORDER — EPHEDRINE SULFATE (PRESSORS) 25 MG/5ML IV SOSY
PREFILLED_SYRINGE | INTRAVENOUS | Status: DC | PRN
  Administered 2024-05-16 (×2): 10 mg via INTRAVENOUS
  Administered 2024-05-16: 5 mg via INTRAVENOUS

## 2024-05-16 MED ORDER — ONDANSETRON HCL 4 MG/2ML IJ SOLN: 4.0000 mg | Freq: Four times a day (QID) | INTRAMUSCULAR | Status: DC | PRN

## 2024-05-16 MED ORDER — PHENYLEPHRINE 80 MCG/ML (10ML) SYRINGE FOR IV PUSH (FOR BLOOD PRESSURE SUPPORT)
PREFILLED_SYRINGE | INTRAVENOUS | Status: AC
  Filled 2024-05-16: qty 10

## 2024-05-16 MED ORDER — ONDANSETRON 4 MG PO TBDP
4.0000 mg | ORAL_TABLET | Freq: Four times a day (QID) | ORAL | Status: DC | PRN
  Administered 2024-05-16: 4 mg via ORAL
  Filled 2024-05-16: qty 1

## 2024-05-16 MED ORDER — PROPOFOL 10 MG/ML IV BOLUS
INTRAVENOUS | Status: DC | PRN
  Administered 2024-05-16: 150 mg via INTRAVENOUS

## 2024-05-16 MED ORDER — FENTANYL CITRATE (PF) 100 MCG/2ML IJ SOLN
INTRAMUSCULAR | Status: AC
  Filled 2024-05-16: qty 2

## 2024-05-16 MED ORDER — KETOROLAC TROMETHAMINE 30 MG/ML IJ SOLN
30.0000 mg | Freq: Three times a day (TID) | INTRAMUSCULAR | Status: DC
  Administered 2024-05-16 – 2024-05-17 (×2): 30 mg via INTRAVENOUS
  Filled 2024-05-16 (×2): qty 1

## 2024-05-16 MED ORDER — ONDANSETRON HCL 4 MG/2ML IJ SOLN
INTRAMUSCULAR | Status: DC | PRN
  Administered 2024-05-16: 4 mg via INTRAVENOUS

## 2024-05-16 MED ORDER — EPHEDRINE 5 MG/ML INJ
INTRAVENOUS | Status: AC
  Filled 2024-05-16: qty 5

## 2024-05-16 MED ORDER — KCL IN DEXTROSE-NACL 20-5-0.45 MEQ/L-%-% IV SOLN
INTRAVENOUS | Status: DC
  Filled 2024-05-16: qty 1000

## 2024-05-16 MED ORDER — CELECOXIB 200 MG PO CAPS
ORAL_CAPSULE | ORAL | Status: AC
  Filled 2024-05-16: qty 1

## 2024-05-16 MED ORDER — PHENYLEPHRINE HCL (PRESSORS) 10 MG/ML IV SOLN
INTRAVENOUS | Status: DC | PRN
  Administered 2024-05-16 (×3): 80 ug via INTRAVENOUS

## 2024-05-16 MED ORDER — OXYCODONE HCL 5 MG/5ML PO SOLN: 5.0000 mg | Freq: Once | ORAL | Status: DC | PRN

## 2024-05-16 MED ORDER — FENTANYL CITRATE (PF) 100 MCG/2ML IJ SOLN
INTRAMUSCULAR | Status: DC | PRN
  Administered 2024-05-16 (×3): 50 ug via INTRAVENOUS

## 2024-05-16 MED ORDER — SCOPOLAMINE 1 MG/3DAYS TD PT72
1.0000 | MEDICATED_PATCH | TRANSDERMAL | Status: DC
  Administered 2024-05-16: 1 mg via TRANSDERMAL

## 2024-05-16 MED ORDER — CEFAZOLIN SODIUM-DEXTROSE 2-4 GM/100ML-% IV SOLN
2.0000 g | INTRAVENOUS | Status: AC
  Administered 2024-05-16: 2 g via INTRAVENOUS

## 2024-05-16 MED ORDER — LIDOCAINE HCL (CARDIAC) PF 100 MG/5ML IV SOSY
PREFILLED_SYRINGE | INTRAVENOUS | Status: DC | PRN
  Administered 2024-05-16: 60 mg via INTRAVENOUS

## 2024-05-16 MED ORDER — LACTATED RINGERS IV SOLN: INTRAVENOUS | Status: DC

## 2024-05-16 MED ORDER — ONDANSETRON HCL 4 MG/2ML IJ SOLN: 4.0000 mg | Freq: Once | INTRAMUSCULAR | Status: DC | PRN

## 2024-05-16 MED ORDER — ROCURONIUM BROMIDE 100 MG/10ML IV SOLN
INTRAVENOUS | Status: DC | PRN
  Administered 2024-05-16: 60 mg via INTRAVENOUS

## 2024-05-16 MED ORDER — CELECOXIB 200 MG PO CAPS
200.0000 mg | ORAL_CAPSULE | ORAL | Status: AC
  Administered 2024-05-16: 200 mg via ORAL

## 2024-05-16 MED ORDER — DEXMEDETOMIDINE HCL IN NACL 80 MCG/20ML IV SOLN
INTRAVENOUS | Status: DC | PRN
  Administered 2024-05-16: 8 ug via INTRAVENOUS

## 2024-05-16 MED ORDER — ALBUMIN HUMAN 5 % IV SOLN: INTRAVENOUS | Status: DC | PRN

## 2024-05-16 MED ORDER — GABAPENTIN 300 MG PO CAPS: 300.0000 mg | ORAL_CAPSULE | Freq: Once | ORAL | Status: AC

## 2024-05-16 MED ORDER — BUPIVACAINE HCL (PF) 0.5 % IJ SOLN
INTRAMUSCULAR | Status: DC | PRN
  Administered 2024-05-16: 30 mL

## 2024-05-16 MED ORDER — 0.9 % SODIUM CHLORIDE (POUR BTL) OPTIME
TOPICAL | Status: DC | PRN
  Administered 2024-05-16: 200 mL

## 2024-05-16 MED ORDER — LOSARTAN POTASSIUM 50 MG PO TABS: 50.0000 mg | ORAL_TABLET | Freq: Every day | ORAL | Status: DC

## 2024-05-16 MED ORDER — DIPHENHYDRAMINE HCL 50 MG/ML IJ SOLN
INTRAMUSCULAR | Status: AC
  Filled 2024-05-16: qty 1

## 2024-05-16 NOTE — Op Note (Signed)
 Operative Note   DATE OF OPERATION: 1.20.2026  LOCATION: [facility]-outpatient  SURGICAL DIVISION: Plastic Surgery  PREOPERATIVE DIAGNOSES:  1. Macromastia 2. Chronic neck and back pain  POSTOPERATIVE DIAGNOSES:  same  PROCEDURE:  Bilateral breast reduction  SURGEON: Dawn Ranks MD MBA  ASSISTANT: none  ANESTHESIA:  General.   EBL: 25 ml  COMPLICATIONS: None immediate.   INDICATIONS FOR PROCEDURE:  The patient, Dawn Reilly, is a 59 y.o. female born on April 15, 1966, is here for treatment chronic neck and back pain in setting of macromastia that has failed conservative measures.   FINDINGS: Right reduction 872 g Left reduction 872 g  DESCRIPTION OF PROCEDURE:  The patient was marked standing in the preoperative area to mark sternal notch, chest midline, anterior axillary lines, inframammary folds. The location of new nipple areolar complex was marked at level of on inframammary fold on anterior surface breast by palpation. This was marked symmetric over bilateral breasts. With aid of Wise pattern marker, location of new nipple areolar complex and vertical limbs (8 cm) were marked by displacement of breasts along meridian. The patient was taken to the operating room. SCDs were placed and IV antibiotics were given. The patient's operative site was prepped and draped in a sterile fashion. A time out was performed and all information was confirmed to be correct.     I began on left breast. Over left breast, superior medial pedicle marked and nipple areolar complex incised with 45 diameter marker. Pedicle deepithlialized and developed to chest wall. Pedicle developed to chest wall. Breast tissue resected over lower pole. Medial and lateral flaps developed. Additional superior and lateral breast tissue excised. Breast tailor tacked closed.   I then directed attention to right breast where superior medial pedicle designed. NAC incised with 45 diameter marker. The pedicle was  deepithelialized. Pedicle developed to chest wall. Breast tissue resected over lower pole. Medial and lateral flaps developed. Additional superior and lateral breast tissue excised. Breast tailor tacked closed. Patient assessed for symmetry. Breast cavities irrigated and hemostasis obtained. Local anesthetic infiltrated throughout each breast. 15 Fr JP placed in each breast and secured with 2-0 nylon. Closure completed bilateral with interrupted and short running 3-0 vicryl used to approximate dermis along remainder inframammary fold and vertical limb. NAC inset with 3-0 vicryl in dermis. Skin closure completed with 4-0 monocryl subcuticular throughout vertical limbs and NAC. Skin closure completed with 3-0 monocryl along each IMF. Tissue adhesive applied. Dry dressing and breast binder applied.  The patient was allowed to wake from anesthesia, extubated and taken to the recovery room in satisfactory condition.   SPECIMENS: right and left breast reduction  DRAINS: 15 Fr JP in right and left breast  Dawn Ranks, MD [facility]  Office/ physician access line after hours [phone]

## 2024-05-16 NOTE — Anesthesia Procedure Notes (Signed)
 Procedure Name: Intubation Date/Time: 05/16/2024 12:46 PM  Performed by: Emilio Rock BIRCH, CRNAPre-anesthesia Checklist: Patient identified, Emergency Drugs available, Suction available and Patient being monitored Patient Re-evaluated:Patient Re-evaluated prior to induction Oxygen Delivery Method: Circle system utilized Preoxygenation: Pre-oxygenation with 100% oxygen Induction Type: IV induction Ventilation: Mask ventilation without difficulty Laryngoscope Size: Mac and 3 Grade View: Grade I Tube type: Oral Tube size: 7.0 mm Number of attempts: 1 Airway Equipment and Method: Stylet and Oral airway Placement Confirmation: ETT inserted through vocal cords under direct vision, positive ETCO2 and breath sounds checked- equal and bilateral Secured at: 22 cm Tube secured with: Tape Dental Injury: Teeth and Oropharynx as per pre-operative assessment

## 2024-05-16 NOTE — Anesthesia Postprocedure Evaluation (Signed)
"   Anesthesia Post Note  Patient: Dawn Reilly  Procedure(s) Performed: MAMMOPLASTY, REDUCTION (Bilateral: Breast)     Patient location during evaluation: PACU Anesthesia Type: General Level of consciousness: awake and alert Pain management: pain level controlled Vital Signs Assessment: post-procedure vital signs reviewed and stable Respiratory status: spontaneous breathing, nonlabored ventilation and respiratory function stable Cardiovascular status: blood pressure returned to baseline and stable Postop Assessment: no apparent nausea or vomiting Anesthetic complications: no   No notable events documented.  Last Vitals:  Vitals:   05/16/24 1741 05/16/24 1915  BP: 131/71   Pulse: 68 76  Resp: 18 18  Temp: 36.5 C   SpO2: 96% 99%    Last Pain:  Vitals:   05/16/24 1915  TempSrc:   PainSc: 3                  Garnette FORBES Skillern      "

## 2024-05-16 NOTE — Interval H&P Note (Signed)
 History and Physical Interval Note:  05/16/2024 11:57 AM  Dawn Reilly  has presented today for surgery, with the diagnosis of Macromastia, chronic neck and back pain.  The various methods of treatment have been discussed with the patient and family. After consideration of risks, benefits and other options for treatment, the patient has consented to  Procedures: MAMMOPLASTY, REDUCTION (Bilateral) as a surgical intervention.  The patient's history has been reviewed, patient examined, no change in status, stable for surgery.  I have reviewed the patient's chart and labs.  Questions were answered to the patient's satisfaction.     [name]

## 2024-05-16 NOTE — Transfer of Care (Signed)
 Immediate Anesthesia Transfer of Care Note  Patient: Dawn Reilly  Procedure(s) Performed: Procedures (LRB): MAMMOPLASTY, REDUCTION (Bilateral)  Patient Location: PACU  Anesthesia Type: General  Level of Consciousness: awake, oriented, sedated and patient cooperative  Airway & Oxygen Therapy: Patient Spontanous Breathing and Patient connected to face mask oxygen  Post-op Assessment: Report given to PACU RN and Post -op Vital signs reviewed and stable  Post vital signs: Reviewed and stable  Complications: No apparent anesthesia complications Last Vitals:  Vitals Value Taken Time  BP 133/66 05/16/24 16:06  Temp    Pulse 93 05/16/24 16:11  Resp 16 05/16/24 16:10  SpO2 98 % 05/16/24 16:11  Vitals shown include unfiled device data.  Last Pain:  Vitals:   05/16/24 1050  TempSrc: Temporal  PainSc: 0-No pain      Patients Stated Pain Goal: 3 (05/16/24 1050)  Complications: No notable events documented.

## 2024-05-17 ENCOUNTER — Encounter (HOSPITAL_BASED_OUTPATIENT_CLINIC_OR_DEPARTMENT_OTHER): Payer: Self-pay | Admitting: Plastic Surgery

## 2024-05-17 DIAGNOSIS — N62 Hypertrophy of breast: Secondary | ICD-10-CM | POA: Diagnosis not present

## 2024-05-17 MED ORDER — METHOCARBAMOL 500 MG PO TABS: 500.0000 mg | ORAL_TABLET | Freq: Three times a day (TID) | ORAL | 0 refills | Status: AC | PRN

## 2024-05-17 NOTE — Discharge Summary (Signed)
 Physician Discharge Summary  Patient ID: Dawn Reilly MRN: [id number] DOB/AGE: 59/15/1967 59 y.o.  Admit date: 05/16/2024 Discharge date: 05/17/2024  Admission Diagnoses: Macromastia chronic neck and back pain  Discharge Diagnoses:  same  Discharged Condition: stable  Hospital Course: Post operatively patient had one episode emesis. Robaxin  added to discharge medications as this helped with pain. Instructed on bathing and drain care. Patient able to tolerate diet post above. Patient ambulatory with minimal assist.  Treatments: surgery: bilateral breast reduction 1.20.2026  Discharge Exam: Blood pressure 99/60, pulse 81, temperature 98.4 F (36.9 C), resp. rate 16, height 5' 2 (1.575 m), weight 99.3 kg, last menstrual period 07/28/2015, SpO2 98%. Incision/Wound: incisions intact dry breasts soft drains serosanguinous NAC viable  Disposition: Discharge disposition: 01-Home or Self Care       Discharge Instructions     Call MD for:  redness, tenderness, or signs of infection (pain, swelling, bleeding, redness, odor or green/yellow discharge around incision site)   Complete by: As directed    Call MD for:  temperature >100.5   Complete by: As directed    Discharge instructions   Complete by: As directed    Ok to remove dressings and shower am 1.22.2026. Soap and water ok, pat incisions dry. No creams or ointments over incisions. Do not let drains dangle in shower, attach to lanyard or similar.Strip and record drains twice daily and bring log to clinic visit.  Breast binder or soft compression bra all other times.  Ok to raise arms above shoulders for bathing and dressing.  No house yard work or exercise until cleared by MD.   Manuelita to use Tylenol  for pain. Ice packs to chest for comfort. Patient received all Rx preop. Recommend Miralax or Dulcolax as needed for constipation.   Driving Restrictions   Complete by: As directed    No driving if taking prescription pain  medication   Lifting restrictions   Complete by: As directed    No lifting > 5-10 lbs until cleared by MD   Resume previous diet   Complete by: As directed       Allergies as of 05/17/2024       Reactions   Eicosapentaenoic Acid (epa) (fish) Anaphylaxis   All seafoods   Shellfish Allergy Swelling        Medication List     TAKE these medications    losartan  50 MG tablet Commonly known as: COZAAR  SMARTSIG:1 Tablet(s) By Mouth Every Evening   methocarbamol  500 MG tablet Commonly known as: ROBAXIN  Take 1 tablet (500 mg total) by mouth every 8 (eight) hours as needed for muscle spasms.   multivitamin capsule Take 1 capsule by mouth daily.   OZEMPIC (1 MG/DOSE) [place] Inject into the skin.   TYLENOL  8 HOUR PO Take by mouth.   zolpidem 5 MG tablet Commonly known as: AMBIEN TK ONE T PO QHS PRF INSOMNIA        Follow-up Information     Arelia Filippo, MD Follow up in 1 week(s).   Specialty: Plastic Surgery Why: as scheduled Contact information: [address] [place] [address] [phone]                 Signed: Filippo Arelia 05/17/2024, 7:38 AM

## 2024-05-18 LAB — SURGICAL PATHOLOGY
